# Patient Record
Sex: Female | Born: 1945 | Race: White | Hispanic: No | Marital: Married | Smoking: Never smoker
Health system: Southern US, Community
[De-identification: ages and names within clinical notes are randomized; demographics above are authoritative.]

## PROBLEM LIST (undated history)

## (undated) DIAGNOSIS — E559 Vitamin D deficiency, unspecified: Secondary | ICD-10-CM

## (undated) DIAGNOSIS — G4700 Insomnia, unspecified: Secondary | ICD-10-CM

## (undated) DIAGNOSIS — M353 Polymyalgia rheumatica: Secondary | ICD-10-CM

## (undated) DIAGNOSIS — M8580 Other specified disorders of bone density and structure, unspecified site: Secondary | ICD-10-CM

## (undated) DIAGNOSIS — K649 Unspecified hemorrhoids: Secondary | ICD-10-CM

## (undated) DIAGNOSIS — Z780 Asymptomatic menopausal state: Secondary | ICD-10-CM

## (undated) DIAGNOSIS — R7303 Prediabetes: Secondary | ICD-10-CM

## (undated) DIAGNOSIS — E785 Hyperlipidemia, unspecified: Secondary | ICD-10-CM

## (undated) DIAGNOSIS — I10 Essential (primary) hypertension: Secondary | ICD-10-CM

## (undated) DIAGNOSIS — K625 Hemorrhage of anus and rectum: Secondary | ICD-10-CM

## (undated) DIAGNOSIS — R002 Palpitations: Secondary | ICD-10-CM

## (undated) HISTORY — DX: Prediabetes: R73.03

## (undated) HISTORY — DX: Vitamin D deficiency, unspecified: E55.9

## (undated) HISTORY — DX: Insomnia, unspecified: G47.00

## (undated) HISTORY — PX: ABDOMINAL HYSTERECTOMY: SHX81

## (undated) HISTORY — PX: SHOULDER SURGERY: SHX246

---
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
## 1998-07-13 ENCOUNTER — Other Ambulatory Visit: Admission: RE | Admit: 1998-07-13 | Discharge: 1998-07-13 | Payer: Self-pay | Admitting: Obstetrics and Gynecology

## 1999-06-22 ENCOUNTER — Encounter: Admission: RE | Admit: 1999-06-22 | Discharge: 1999-06-22 | Payer: Self-pay | Admitting: Family Medicine

## 1999-08-09 ENCOUNTER — Other Ambulatory Visit: Admission: RE | Admit: 1999-08-09 | Discharge: 1999-08-09 | Payer: Self-pay | Admitting: Obstetrics and Gynecology

## 1999-12-07 ENCOUNTER — Encounter: Admission: RE | Admit: 1999-12-07 | Discharge: 1999-12-07 | Payer: Self-pay | Admitting: Obstetrics and Gynecology

## 1999-12-07 ENCOUNTER — Encounter: Payer: Self-pay | Admitting: Obstetrics and Gynecology

## 2000-09-11 ENCOUNTER — Other Ambulatory Visit: Admission: RE | Admit: 2000-09-11 | Discharge: 2000-09-11 | Payer: Self-pay | Admitting: Obstetrics and Gynecology

## 2000-12-10 ENCOUNTER — Encounter: Payer: Self-pay | Admitting: Obstetrics and Gynecology

## 2000-12-10 ENCOUNTER — Encounter: Admission: RE | Admit: 2000-12-10 | Discharge: 2000-12-10 | Payer: Self-pay | Admitting: Obstetrics and Gynecology

## 2001-01-12 ENCOUNTER — Encounter: Payer: Self-pay | Admitting: Neurosurgery

## 2001-01-12 ENCOUNTER — Ambulatory Visit (HOSPITAL_COMMUNITY): Admission: RE | Admit: 2001-01-12 | Discharge: 2001-01-12 | Payer: Self-pay | Admitting: Neurosurgery

## 2001-11-14 ENCOUNTER — Other Ambulatory Visit: Admission: RE | Admit: 2001-11-14 | Discharge: 2001-11-14 | Payer: Self-pay | Admitting: Obstetrics and Gynecology

## 2001-12-16 ENCOUNTER — Encounter: Admission: RE | Admit: 2001-12-16 | Discharge: 2001-12-16 | Payer: Self-pay | Admitting: Obstetrics and Gynecology

## 2001-12-16 ENCOUNTER — Encounter: Payer: Self-pay | Admitting: Obstetrics and Gynecology

## 2002-08-06 ENCOUNTER — Encounter: Payer: Self-pay | Admitting: Family Medicine

## 2002-08-06 ENCOUNTER — Encounter: Admission: RE | Admit: 2002-08-06 | Discharge: 2002-08-06 | Payer: Self-pay | Admitting: Family Medicine

## 2003-02-10 ENCOUNTER — Other Ambulatory Visit: Admission: RE | Admit: 2003-02-10 | Discharge: 2003-02-10 | Payer: Self-pay | Admitting: Obstetrics and Gynecology

## 2005-06-28 ENCOUNTER — Encounter: Admission: RE | Admit: 2005-06-28 | Discharge: 2005-06-28 | Payer: Self-pay | Admitting: Obstetrics and Gynecology

## 2006-07-24 ENCOUNTER — Encounter: Admission: RE | Admit: 2006-07-24 | Discharge: 2006-07-24 | Payer: Self-pay | Admitting: Obstetrics and Gynecology

## 2007-09-24 ENCOUNTER — Encounter: Admission: RE | Admit: 2007-09-24 | Discharge: 2007-09-24 | Payer: Self-pay | Admitting: Obstetrics and Gynecology

## 2008-09-28 ENCOUNTER — Encounter: Admission: RE | Admit: 2008-09-28 | Discharge: 2008-09-28 | Payer: Self-pay | Admitting: Obstetrics and Gynecology

## 2008-09-29 ENCOUNTER — Encounter: Admission: RE | Admit: 2008-09-29 | Discharge: 2008-09-29 | Payer: Self-pay | Admitting: Obstetrics and Gynecology

## 2008-09-29 HISTORY — PX: BREAST BIOPSY: SHX20

## 2009-10-04 ENCOUNTER — Encounter: Admission: RE | Admit: 2009-10-04 | Discharge: 2009-10-04 | Payer: Self-pay | Admitting: Obstetrics and Gynecology

## 2010-06-12 ENCOUNTER — Encounter: Payer: Self-pay | Admitting: Obstetrics and Gynecology

## 2010-10-11 LAB — HM DEXA SCAN

## 2011-09-03 ENCOUNTER — Other Ambulatory Visit: Payer: Self-pay | Admitting: Family Medicine

## 2011-09-03 DIAGNOSIS — Z1231 Encounter for screening mammogram for malignant neoplasm of breast: Secondary | ICD-10-CM

## 2011-10-12 ENCOUNTER — Ambulatory Visit
Admission: RE | Admit: 2011-10-12 | Discharge: 2011-10-12 | Disposition: A | Discharge status: Home / Self Care | Payer: BC Managed Care – PPO | Source: Ambulatory Visit | Attending: Family Medicine | Admitting: Family Medicine

## 2011-10-12 DIAGNOSIS — Z1231 Encounter for screening mammogram for malignant neoplasm of breast: Secondary | ICD-10-CM

## 2012-11-19 LAB — HM DEXA SCAN

## 2013-01-13 ENCOUNTER — Other Ambulatory Visit: Payer: Self-pay

## 2013-01-13 DIAGNOSIS — Z1231 Encounter for screening mammogram for malignant neoplasm of breast: Secondary | ICD-10-CM

## 2013-01-23 ENCOUNTER — Ambulatory Visit
Admission: RE | Admit: 2013-01-23 | Discharge: 2013-01-23 | Disposition: A | Discharge status: Home / Self Care | Payer: BC Managed Care – PPO | Source: Ambulatory Visit

## 2013-01-23 DIAGNOSIS — Z1231 Encounter for screening mammogram for malignant neoplasm of breast: Secondary | ICD-10-CM

## 2013-07-15 ENCOUNTER — Other Ambulatory Visit (HOSPITAL_COMMUNITY): Payer: Self-pay | Admitting: *Deleted

## 2013-07-15 DIAGNOSIS — Z139 Encounter for screening, unspecified: Secondary | ICD-10-CM

## 2013-07-16 ENCOUNTER — Ambulatory Visit (HOSPITAL_COMMUNITY): Payer: Medicare Other

## 2013-07-20 ENCOUNTER — Ambulatory Visit (HOSPITAL_COMMUNITY): Payer: Medicare Other

## 2013-07-22 ENCOUNTER — Other Ambulatory Visit (HOSPITAL_COMMUNITY): Payer: Self-pay | Admitting: *Deleted

## 2013-07-22 ENCOUNTER — Ambulatory Visit (HOSPITAL_COMMUNITY): Payer: Medicare Other | Attending: Family Medicine

## 2013-07-22 DIAGNOSIS — Z1389 Encounter for screening for other disorder: Secondary | ICD-10-CM | POA: Insufficient documentation

## 2013-07-22 DIAGNOSIS — Z139 Encounter for screening, unspecified: Secondary | ICD-10-CM

## 2013-07-22 DIAGNOSIS — I700 Atherosclerosis of aorta: Secondary | ICD-10-CM

## 2014-03-09 ENCOUNTER — Other Ambulatory Visit: Payer: Self-pay

## 2014-03-09 DIAGNOSIS — Z1231 Encounter for screening mammogram for malignant neoplasm of breast: Secondary | ICD-10-CM

## 2014-03-16 ENCOUNTER — Ambulatory Visit
Admission: RE | Admit: 2014-03-16 | Discharge: 2014-03-16 | Disposition: A | Discharge status: Home / Self Care | Payer: 59 | Source: Ambulatory Visit

## 2014-03-16 DIAGNOSIS — Z1231 Encounter for screening mammogram for malignant neoplasm of breast: Secondary | ICD-10-CM

## 2014-04-16 ENCOUNTER — Other Ambulatory Visit (HOSPITAL_COMMUNITY): Payer: Self-pay | Admitting: Family Medicine

## 2014-07-07 ENCOUNTER — Other Ambulatory Visit: Payer: Self-pay | Admitting: Obstetrics and Gynecology

## 2014-07-08 LAB — CYTOLOGY - PAP

## 2014-10-07 ENCOUNTER — Other Ambulatory Visit: Payer: Self-pay | Admitting: Gastroenterology

## 2014-12-08 LAB — HM DEXA SCAN

## 2015-03-14 ENCOUNTER — Other Ambulatory Visit: Payer: Self-pay

## 2015-03-14 DIAGNOSIS — Z1231 Encounter for screening mammogram for malignant neoplasm of breast: Secondary | ICD-10-CM

## 2015-04-20 ENCOUNTER — Ambulatory Visit
Admission: RE | Admit: 2015-04-20 | Discharge: 2015-04-20 | Disposition: A | Discharge status: Home / Self Care | Payer: Medicare Other | Source: Ambulatory Visit

## 2015-04-20 DIAGNOSIS — Z1231 Encounter for screening mammogram for malignant neoplasm of breast: Secondary | ICD-10-CM

## 2016-05-28 ENCOUNTER — Other Ambulatory Visit: Payer: Self-pay | Admitting: Physician Assistant

## 2016-05-28 DIAGNOSIS — Z1231 Encounter for screening mammogram for malignant neoplasm of breast: Secondary | ICD-10-CM

## 2016-07-05 ENCOUNTER — Ambulatory Visit
Admission: RE | Admit: 2016-07-05 | Discharge: 2016-07-05 | Disposition: A | Discharge status: Home / Self Care | Payer: Medicare Other | Source: Ambulatory Visit | Attending: Physician Assistant | Admitting: Physician Assistant

## 2016-07-05 DIAGNOSIS — Z1231 Encounter for screening mammogram for malignant neoplasm of breast: Secondary | ICD-10-CM

## 2017-06-10 ENCOUNTER — Other Ambulatory Visit: Payer: Self-pay | Admitting: Obstetrics and Gynecology

## 2017-06-10 DIAGNOSIS — Z1231 Encounter for screening mammogram for malignant neoplasm of breast: Secondary | ICD-10-CM

## 2017-06-13 LAB — HM DEXA SCAN

## 2017-07-08 ENCOUNTER — Ambulatory Visit: Payer: Medicare Other

## 2017-07-25 ENCOUNTER — Ambulatory Visit
Admission: RE | Admit: 2017-07-25 | Discharge: 2017-07-25 | Disposition: A | Discharge status: Home / Self Care | Payer: Medicare Other | Source: Ambulatory Visit | Attending: Obstetrics and Gynecology | Admitting: Obstetrics and Gynecology

## 2017-07-25 DIAGNOSIS — Z1231 Encounter for screening mammogram for malignant neoplasm of breast: Secondary | ICD-10-CM

## 2018-12-08 ENCOUNTER — Other Ambulatory Visit: Payer: Self-pay | Admitting: Cytopathology

## 2018-12-08 ENCOUNTER — Other Ambulatory Visit: Payer: Self-pay | Admitting: Obstetrics and Gynecology

## 2018-12-08 DIAGNOSIS — Z1231 Encounter for screening mammogram for malignant neoplasm of breast: Secondary | ICD-10-CM

## 2019-01-22 ENCOUNTER — Ambulatory Visit
Admission: RE | Admit: 2019-01-22 | Discharge: 2019-01-22 | Disposition: A | Discharge status: Home / Self Care | Payer: Medicare Other | Source: Ambulatory Visit | Attending: Obstetrics and Gynecology | Admitting: Obstetrics and Gynecology

## 2019-01-22 ENCOUNTER — Other Ambulatory Visit: Payer: Self-pay

## 2019-01-22 DIAGNOSIS — Z1231 Encounter for screening mammogram for malignant neoplasm of breast: Secondary | ICD-10-CM

## 2019-03-16 NOTE — Progress Notes (Signed)
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  Patient has had problems with reflux in the past.  She will feel pain in her substernal area radiating to her neck.  Typically occurs after eating or at night.  Some increase in symptoms recently.  She also has had some chest tightness that begins when she starts walking and improves as she continues.  This has been continuing for years.  No radiation.  No associated symptoms.  Cardiology now asked to evaluate.  She has dyspnea with more vigorous activities but not routine activities.  No orthopnea, PND or pedal edema.  No syncope.  Current Outpatient Medications  Medication Sig Dispense Refill  . ALPRAZolam (XANAX) 0.25 MG tablet Take 0.25 mg by mouth at bedtime as needed for anxiety.    Marland Kitchen aspirin EC 81 MG tablet Take 81 mg by mouth daily.    Marland Kitchen azelaic acid (AZELEX) 20 % cream Apply topically 2 (two) times daily. After skin is thoroughly washed and patted dry, gently but thoroughly massage a thin film of azelaic acid cream into the affected area twice daily, in the morning and evening.    . bisoprolol-hydrochlorothiazide (ZIAC) 10-6.25 MG tablet Take 1 tablet by mouth daily.    Marland Kitchen lisinopril (ZESTRIL) 30 MG tablet Take 30 mg by mouth daily.    . Magnesium 250 MG TABS Take by mouth.    . OMEPRAZOLE PO Take by mouth.    . pravastatin (PRAVACHOL) 20 MG tablet Take 40 mg by mouth daily.     . Vitamin D, Ergocalciferol, (DRISDOL) 1.25 MG (50000 UT) CAPS capsule Take 50,000 Units by mouth. EVERY OTHER WEEK     No current facility-administered medications for this visit.     Allergies  Allergen Reactions  . Statins     MYALGIAS     Past Medical History:  Diagnosis Date  . Hemorrhoids 03/18/2019  . HTN (hypertension) 03/18/2019  . Hyperlipidemia 03/18/2019  . Insomnia   . Osteopenia 03/18/2019  .  Palpitations 03/18/2019  . Polymyalgia rheumatica ([place]) 03/18/2019  . Postmenopausal 03/18/2019  . Prediabetes   . Rectal bleeding 03/18/2019  . Vitamin D deficiency     Past Surgical History:  Procedure Laterality Date  . ABDOMINAL HYSTERECTOMY    . BREAST BIOPSY Left 09/29/2008  . SHOULDER SURGERY      Social History   Socioeconomic History  . Marital status: Married    Spouse name: Not on file  . Number of children: Not on file  . Years of education: Not on file  . Highest education level: Not on file  Occupational History  . Not on file  Social Needs  . Financial resource strain: Not on file  . Food insecurity    Worry: Not on file    Inability: Not on file  . Transportation needs    Medical: Not on file    Non-medical: Not on file  Tobacco Use  . Smoking status: Never Smoker  . Smokeless tobacco: Never Used  Substance and Sexual Activity  . Alcohol use: Not Currently  . Drug use: Not on file  . Sexual activity: Not on file    Comment: MARRIED  Lifestyle  . Physical activity    Days per week: Not on file    Minutes per session: Not on file  . Stress: Not on file  Relationships  . Social connections  Talks on phone: Not on file    Gets together: Not on file    Attends religious service: Not on file    Active member of club or organization: Not on file    Attends meetings of clubs or organizations: Not on file    Relationship status: Not on file  . Intimate partner violence    Fear of current or ex partner: Not on file    Emotionally abused: Not on file    Physically abused: Not on file    Forced sexual activity: Not on file  Other Topics Concern  . Not on file  Social History Narrative  . Not on file    Family History  Problem Relation Age of Onset  . Breast cancer Maternal Aunt   . AAA (abdominal aortic aneurysm) Father     ROS: no fevers or chills, productive cough, hemoptysis, dysphasia, odynophagia, melena, hematochezia, dysuria,  hematuria, rash, seizure activity, orthopnea, PND, pedal edema, claudication. Remaining systems are negative.  Physical Exam:   Blood pressure 130/68, pulse (!) 54, height 5\' 4"  (1.626 m), weight 147 lb (66.7 kg).  General:  Well developed/well nourished in NAD Skin warm/dry Patient not depressed No peripheral clubbing Back-normal HEENT-normal/normal eyelids Neck supple/normal carotid upstroke bilaterally; no bruits; no JVD; no thyromegaly chest - CTA/ normal expansion CV - RRR/normal S1 and S2; no murmurs, rubs or gallops;  PMI nondisplaced Abdomen -NT/ND, no HSM, no mass, + bowel sounds, no bruit 2+ femoral pulses, no bruits Ext-no edema, chords, 2+ DP Neuro-grossly nonfocal  ECG -sinus bradycardia at a rate of 55, no ST changes.  Personally reviewed  A/P  1 chest pain-symptoms are somewhat atypical.  However she does have risk factors.  We will plan a CTA to further assess.  She would be deemed intermediate risk.  2 hypertension-blood pressure is controlled.  Continue present medications and follow.  3 hyperlipidemia-continue statin.  , MD

## 2019-03-18 ENCOUNTER — Encounter: Payer: Self-pay | Admitting: Cardiology

## 2019-03-18 ENCOUNTER — Other Ambulatory Visit: Payer: Self-pay

## 2019-03-18 ENCOUNTER — Ambulatory Visit (INDEPENDENT_AMBULATORY_CARE_PROVIDER_SITE_OTHER): Payer: Medicare Other | Admitting: Cardiology

## 2019-03-18 VITALS — BP 130/68 | HR 54 | Ht 64.0 in | Wt 147.0 lb

## 2019-03-18 DIAGNOSIS — M353 Polymyalgia rheumatica: Secondary | ICD-10-CM

## 2019-03-18 DIAGNOSIS — E7800 Pure hypercholesterolemia, unspecified: Secondary | ICD-10-CM

## 2019-03-18 DIAGNOSIS — I10 Essential (primary) hypertension: Secondary | ICD-10-CM

## 2019-03-18 DIAGNOSIS — M8580 Other specified disorders of bone density and structure, unspecified site: Secondary | ICD-10-CM

## 2019-03-18 DIAGNOSIS — R002 Palpitations: Secondary | ICD-10-CM

## 2019-03-18 DIAGNOSIS — R072 Precordial pain: Secondary | ICD-10-CM

## 2019-03-18 DIAGNOSIS — K649 Unspecified hemorrhoids: Secondary | ICD-10-CM

## 2019-03-18 DIAGNOSIS — E785 Hyperlipidemia, unspecified: Secondary | ICD-10-CM

## 2019-03-18 DIAGNOSIS — K625 Hemorrhage of anus and rectum: Secondary | ICD-10-CM

## 2019-03-18 DIAGNOSIS — Z780 Asymptomatic menopausal state: Secondary | ICD-10-CM

## 2019-03-18 HISTORY — DX: Asymptomatic menopausal state: Z78.0

## 2019-03-18 HISTORY — DX: Essential (primary) hypertension: I10

## 2019-03-18 HISTORY — DX: Unspecified hemorrhoids: K64.9

## 2019-03-18 HISTORY — DX: Other specified disorders of bone density and structure, unspecified site: M85.80

## 2019-03-18 HISTORY — DX: Palpitations: R00.2

## 2019-03-18 HISTORY — DX: Hemorrhage of anus and rectum: K62.5

## 2019-03-18 HISTORY — DX: Polymyalgia rheumatica: M35.3

## 2019-03-18 HISTORY — DX: Hyperlipidemia, unspecified: E78.5

## 2019-03-18 NOTE — Patient Instructions (Addendum)
Medication Instructions:  NO CHANGE *If you need a refill on your cardiac medications before your next appointment, please call your pharmacy*  Lab Work: If you have labs (blood work) drawn today and your tests are completely normal, you will receive your results only by: Marland Kitchen MyChart Message (if you have MyChart) OR . A paper copy in the mail If you have any lab test that is abnormal or we need to change your treatment, we will call you to review the results.  Testing/Procedures: Your cardiac CT will be scheduled at one of the below locations:   [facility] [address] [place], [place] [address] [phone]  If scheduled at [facility], please arrive at the [facility] main entrance of [facility] 30-45 minutes prior to test start time. Proceed to the [facility] Radiology Department (first floor) to check-in and test prep.  Please follow these instructions carefully (unless otherwise directed):  Hold all erectile dysfunction medications at least 3 days (72 hrs) prior to test.  On the Night Before the Test: . Be sure to Drink plenty of water. . Do not consume any caffeinated/decaffeinated beverages or chocolate 12 hours prior to your test. . Do not take any antihistamines 12 hours prior to your test.  On the Day of the Test: . Drink plenty of water. Do not drink any water within one hour of the test. . Do not eat any food 4 hours prior to the test. . You may take your regular medications prior to the test.  . HOLD Furosemide/Hydrochlorothiazide morning of the test. . FEMALES- please wear underwire-free bra if available      After the Test: . Drink plenty of water. . After receiving IV contrast, you may experience a mild flushed feeling. This is normal. . On occasion, you may experience a mild rash up to 24 hours after the test. This is not dangerous. If this occurs, you can take Benadryl 25 mg and increase your fluid intake. . If you experience  trouble breathing, this can be serious. If it is severe call 911 IMMEDIATELY. If it is mild, please call our office. . If you take any of these medications: Glipizide/Metformin, Avandament, Glucavance, please do not take 48 hours after completing test unless otherwise instructed.   Once we have confirmed authorization from your insurance company, we will call you to set up a date and time for your test.   For non-scheduling related questions, please contact the cardiac imaging nurse navigator should you have any questions/concerns: Marchia Bond, RN Navigator Cardiac Imaging [facility] [phone] Office     Follow-Up: At [facility], you and your health needs are our priority.  As part of our continuing mission to provide you with exceptional heart care, we have created designated Provider Care Teams.  These Care Teams include your primary Cardiologist (physician) and Advanced Practice Providers (APPs -  Physician Assistants and Nurse Practitioners) who all work together to provide you with the care you need, when you need it.  Your next appointment:   AS NEEDED PENDING TEST RESULTS  Provider:   Kirk Ruths, MD

## 2019-05-04 ENCOUNTER — Other Ambulatory Visit: Payer: Self-pay | Admitting: *Deleted

## 2019-05-04 DIAGNOSIS — R072 Precordial pain: Secondary | ICD-10-CM

## 2019-05-06 ENCOUNTER — Encounter: Payer: Self-pay | Admitting: *Deleted

## 2019-05-06 LAB — BASIC METABOLIC PANEL
BUN/Creatinine Ratio: 23 (ref 12–28)
BUN: 16 mg/dL (ref 8–27)
CO2: 25 mmol/L (ref 20–29)
Calcium: 9.5 mg/dL (ref 8.7–10.3)
Chloride: 101 mmol/L (ref 96–106)
Creatinine, Ser: 0.7 mg/dL (ref 0.57–1.00)
GFR calc Af Amer: 101 mL/min/{1.73_m2} (ref >59)
GFR calc non Af Amer: 87 mL/min/{1.73_m2} (ref >59)
Glucose: 83 mg/dL (ref 65–99)
Potassium: 4.8 mmol/L (ref 3.5–5.2)
Sodium: 140 mmol/L (ref 134–144)

## 2019-05-07 ENCOUNTER — Telehealth (HOSPITAL_COMMUNITY): Payer: Self-pay | Admitting: Emergency Medicine

## 2019-05-07 NOTE — Telephone Encounter (Signed)
Reaching out to patient to offer assistance regarding upcoming cardiac imaging study; pt verbalizes understanding of appt date/time, parking situation and where to check in, pre-test NPO status and medications ordered, and verified current allergies; name and call back number provided for further questions should they arise Willye Javier RN Navigator Cardiac Imaging [facility] [phone] office [phone] cell 

## 2019-05-08 ENCOUNTER — Ambulatory Visit (HOSPITAL_COMMUNITY)
Admission: RE | Admit: 2019-05-08 | Discharge: 2019-05-08 | Disposition: A | Discharge status: Home / Self Care | Payer: Medicare Other | Source: Ambulatory Visit | Attending: Cardiology | Admitting: Cardiology

## 2019-05-08 ENCOUNTER — Other Ambulatory Visit: Payer: Self-pay

## 2019-05-08 DIAGNOSIS — R072 Precordial pain: Secondary | ICD-10-CM | POA: Insufficient documentation

## 2019-05-08 MED ORDER — IOHEXOL 350 MG/ML SOLN
80.0000 mL | Freq: Once | INTRAVENOUS | Status: AC | PRN
  Administered 2019-05-08: 15:00:00 80 mL via INTRAVENOUS

## 2019-05-08 MED ORDER — NITROGLYCERIN 0.4 MG SL SUBL
0.8000 mg | SUBLINGUAL_TABLET | Freq: Once | SUBLINGUAL | Status: AC
  Administered 2019-05-08: 15:00:00 0.8 mg via SUBLINGUAL

## 2019-05-08 MED ORDER — NITROGLYCERIN 0.4 MG SL SUBL
SUBLINGUAL_TABLET | SUBLINGUAL | Status: AC
  Filled 2019-05-08: qty 2

## 2019-05-21 ENCOUNTER — Ambulatory Visit (HOSPITAL_COMMUNITY): Payer: Medicare Other

## 2019-12-30 ENCOUNTER — Other Ambulatory Visit: Payer: Self-pay | Admitting: Obstetrics and Gynecology

## 2019-12-30 DIAGNOSIS — Z1231 Encounter for screening mammogram for malignant neoplasm of breast: Secondary | ICD-10-CM

## 2020-01-05 DIAGNOSIS — J385 Laryngeal spasm: Secondary | ICD-10-CM | POA: Insufficient documentation

## 2020-01-26 ENCOUNTER — Other Ambulatory Visit: Payer: Self-pay

## 2020-01-26 ENCOUNTER — Ambulatory Visit
Admission: RE | Admit: 2020-01-26 | Discharge: 2020-01-26 | Disposition: A | Discharge status: Home / Self Care | Payer: Medicare PPO | Source: Ambulatory Visit | Attending: Obstetrics and Gynecology | Admitting: Obstetrics and Gynecology

## 2020-01-26 DIAGNOSIS — Z1231 Encounter for screening mammogram for malignant neoplasm of breast: Secondary | ICD-10-CM

## 2020-02-11 DIAGNOSIS — Z23 Encounter for immunization: Secondary | ICD-10-CM | POA: Diagnosis not present

## 2020-07-01 DIAGNOSIS — I10 Essential (primary) hypertension: Secondary | ICD-10-CM | POA: Diagnosis not present

## 2020-07-01 DIAGNOSIS — M722 Plantar fascial fibromatosis: Secondary | ICD-10-CM | POA: Diagnosis not present

## 2020-07-01 DIAGNOSIS — I7300 Raynaud's syndrome without gangrene: Secondary | ICD-10-CM | POA: Diagnosis not present

## 2020-07-01 DIAGNOSIS — I868 Varicose veins of other specified sites: Secondary | ICD-10-CM | POA: Diagnosis not present

## 2020-07-01 DIAGNOSIS — I739 Peripheral vascular disease, unspecified: Secondary | ICD-10-CM | POA: Diagnosis not present

## 2020-07-04 ENCOUNTER — Other Ambulatory Visit: Payer: Self-pay | Admitting: Nurse Practitioner

## 2020-07-04 DIAGNOSIS — I739 Peripheral vascular disease, unspecified: Secondary | ICD-10-CM

## 2020-07-06 ENCOUNTER — Ambulatory Visit
Admission: RE | Admit: 2020-07-06 | Discharge: 2020-07-06 | Disposition: A | Discharge status: Home / Self Care | Payer: Medicare PPO | Source: Ambulatory Visit | Attending: Nurse Practitioner | Admitting: Nurse Practitioner

## 2020-07-06 DIAGNOSIS — I739 Peripheral vascular disease, unspecified: Secondary | ICD-10-CM | POA: Diagnosis not present

## 2020-07-19 DIAGNOSIS — E7800 Pure hypercholesterolemia, unspecified: Secondary | ICD-10-CM | POA: Diagnosis not present

## 2020-07-19 DIAGNOSIS — I10 Essential (primary) hypertension: Secondary | ICD-10-CM | POA: Diagnosis not present

## 2020-07-19 DIAGNOSIS — G4700 Insomnia, unspecified: Secondary | ICD-10-CM | POA: Diagnosis not present

## 2020-07-19 DIAGNOSIS — R7303 Prediabetes: Secondary | ICD-10-CM | POA: Diagnosis not present

## 2020-07-19 DIAGNOSIS — Z0000 Encounter for general adult medical examination without abnormal findings: Secondary | ICD-10-CM | POA: Diagnosis not present

## 2020-07-19 DIAGNOSIS — E559 Vitamin D deficiency, unspecified: Secondary | ICD-10-CM | POA: Diagnosis not present

## 2020-08-02 ENCOUNTER — Encounter: Payer: Medicare PPO | Admitting: Vascular Surgery

## 2020-08-09 ENCOUNTER — Ambulatory Visit: Payer: Medicare PPO | Admitting: Vascular Surgery

## 2020-08-09 ENCOUNTER — Encounter: Payer: Self-pay | Admitting: Vascular Surgery

## 2020-08-09 ENCOUNTER — Other Ambulatory Visit: Payer: Self-pay

## 2020-08-09 VITALS — BP 147/59 | HR 59 | Temp 98.4°F | Resp 20 | Ht 64.0 in | Wt 146.0 lb

## 2020-08-09 DIAGNOSIS — R6889 Other general symptoms and signs: Secondary | ICD-10-CM | POA: Diagnosis not present

## 2020-08-09 NOTE — Progress Notes (Signed)
[facility] [place]  ASSESSMENT / PLAN: 75 y.o. female with abnormal home PAD screening test. She has no evidence of hemodynamically significant peripheral arterial disease on exam today or by ABI. She did have a slightly low right great toe pressure ( ) on ABI/PVR, but her ankle arm index was normal bilaterally. She has known history of Raynaud's phenomenon. I counseled her that I do not think she has peripheral arterial disease. Follow up with me as needed.  CHIEF COMPLAINT: Abnormal screening exam  HISTORY OF PRESENT ILLNESS: Rachel PIPPEN is a 75 y.o. female referred to clinic for evaluation of possible PAD.  She had a [employer] home screen for PAD performed which was abnormal she discussed the results with her primary care physician, who referred her for vascular evaluation and ABI.  ABI was read as abnormal.  She was referred for evaluation.  On my interview, the patient reports pain in the fingers and toes after exposure to cold.  The pain resolves with warming.  The patient is ambulatory.  She can walk as far she likes.  She denies symptoms of intermittent claudication, ischemic rest pain, or ischemic ulceration.  Past Medical History:  Diagnosis Date   Hemorrhoids 03/18/2019   HTN (hypertension) 03/18/2019   Hyperlipidemia 03/18/2019   Insomnia    Osteopenia 03/18/2019   Palpitations 03/18/2019   Polymyalgia rheumatica ([facility]) 03/18/2019   Postmenopausal 03/18/2019   Prediabetes    Rectal bleeding 03/18/2019   Vitamin D deficiency     Past Surgical History:  Procedure Laterality Date   ABDOMINAL HYSTERECTOMY     BREAST BIOPSY Left 09/29/2008   SHOULDER SURGERY      Family History  Problem Relation Age of Onset   Breast cancer Maternal Aunt    AAA (abdominal aortic aneurysm) Father     Social History   Socioeconomic History   Marital status: Married    Spouse name: Not on file   Number of children: Not on file   Years of  education: Not on file   Highest education level: Not on file  Occupational History   Not on file  Tobacco Use   Smoking status: Never Smoker   Smokeless tobacco: Never Used  Vaping Use   Vaping Use: Never used  Substance and Sexual Activity   Alcohol use: Not Currently   Drug use: Never   Sexual activity: Not on file    Comment: MARRIED  Other Topics Concern   Not on file  Social History Narrative   Not on file   Social Determinants of Health   Financial Resource Strain: Not on file  Food Insecurity: Not on file  Transportation Needs: Not on file  Physical Activity: Not on file  Stress: Not on file  Social Connections: Not on file  Intimate Partner Violence: Not on file    Allergies  Allergen Reactions   Amlodipine Besylate     Other reaction(s): flushed sensation/rapid heartbeat/difficulty sleeping   Statins     MYALGIAS    Current Outpatient Medications  Medication Sig Dispense Refill   ALPRAZolam (XANAX) 0.25 MG tablet Take 0.25 mg by mouth at bedtime as needed for anxiety.     aspirin EC 81 MG tablet Take 81 mg by mouth daily.     azelaic acid (AZELEX) 20 % cream Apply topically 2 (two) times daily. After skin is thoroughly washed and patted dry, gently but thoroughly massage a thin film of azelaic acid cream into the affected area twice daily,  in the morning and evening.     bisoprolol-hydrochlorothiazide (ZIAC) 10-6.25 MG tablet Take 1 tablet by mouth daily.     lisinopril (ZESTRIL) 30 MG tablet Take 30 mg by mouth daily.     Magnesium 250 MG TABS Take by mouth.     pravastatin (PRAVACHOL) 20 MG tablet Take 80 mg by mouth daily.     Vitamin D, Ergocalciferol, (DRISDOL) 1.25 MG (50000 UT) CAPS capsule Take 50,000 Units by mouth. EVERY OTHER WEEK     OMEPRAZOLE PO Take by mouth. (Patient not taking: Reported on 08/09/2020)     No current facility-administered medications for this visit.    REVIEW OF SYSTEMS:  [X]  denotes positive finding,  [ ]  denotes negative finding Cardiac  Comments:  Chest pain or chest pressure:    Shortness of breath upon exertion:    Short of breath when lying flat:    Irregular heart rhythm:        Vascular    Pain in calf, thigh, or hip brought on by ambulation:    Pain in feet at night that wakes you up from your sleep:     Blood clot in your veins:    Leg swelling:         Pulmonary    Oxygen at home:    Productive cough:     Wheezing:         Neurologic    Sudden weakness in arms or legs:     Sudden numbness in arms or legs:     Sudden onset of difficulty speaking or slurred speech:    Temporary loss of vision in one eye:     Problems with dizziness:         Gastrointestinal    Blood in stool:     Vomited blood:         Genitourinary    Burning when urinating:     Blood in urine:        Psychiatric    Major depression:         Hematologic    Bleeding problems:    Problems with blood clotting too easily:        Skin    Rashes or ulcers:        Constitutional    Fever or chills:      PHYSICAL EXAM  Vitals:   08/09/20 1332  BP: (!) 147/59  Pulse: (!) 59  Resp: 20  Temp: 98.4 F (36.9 C)  SpO2: 98%  Weight: 146 lb (66.2 kg)  Height: 5\' 4"  (1.626 m)    Constitutional: well appearing. no distress. Appears well nourished.  Neurologic: CN intact. no focal findings. non sensory loss. Psychiatric: Mood and affect symmetric and appropriate. Eyes: No icterus. No conjunctival pallor. Ears, nose, throat: mucous membranes moist. Midline trachea.  Cardiac: regular rate and rhythm.  Respiratory: unlabored. Abdominal: soft, non-tender, non-distended.  Peripheral vascular:  2+ radial bilaterally  2+ popliteal bilaterally  2+ PT bilaterally Extremity: No edema. No cyanosis. No pallor.  Skin: No gangrene. No ulceration.  Lymphatic: No Stemmer's sign. No palpable lymphadenopathy.  PERTINENT LABORATORY AND RADIOLOGIC DATA  Most recent CBC No flowsheet data found.    Most recent CMP CMP Latest Ref Rng & Units 05/05/2019  Glucose 65 - 99 mg/dL 83  BUN 8 - 27 mg/dL 16  Creatinine 08/11/20 - mg/dL 05/07/2019  Sodium 8.29 - 5.62 mmol/L 140  Potassium 3.5 - 5.2 mmol/L 4.8  Chloride 96 - 106  mmol/L 101  CO2 20 - 29 mmol/L 25  Calcium 8.7 - 10.3 mg/dL 9.5   CLINICAL DATA:  Peripheral arterial disease  Hypertension  Hyperlipidemia  EXAM: NONINVASIVE PHYSIOLOGIC VASCULAR STUDY OF BILATERAL LOWER EXTREMITIES  TECHNIQUE: Non-invasive vascular evaluation of both lower extremities was performed at rest, including calculation of ankle-brachial indices, multiple segmental pressure evaluation, segmental Doppler and segmental pulse volume recording.  COMPARISON:  None.  FINDINGS: Right Lower Extremity  Resting ABI:  1.08  Resting TBI: 0.36  Segmental Pressures: Normal segmental pressures, no significant (20 mmHg) pressure gradient between adjacent segments. Great toe pressure: 60 mm Hg  Arterial Waveforms: Normal multiphasic arterial waveforms.  PVRs: Normal PVRs with maintained waveform amplitude, augmentation and quality.  Left Lower Extremity:  Resting ABI: 1.08  Resting TBI: 0.55  Segmental Pressures: Normal segmental pressures, no significant (20 mmHg) pressure gradient between adjacent segments. Great toe pressure: 92 mm Hg  Arterial Waveforms: Normal tri-phasic arterial waveforms.  PVRs: Normal PVRs with maintained waveform amplitude, augmentation and quality.  Other: Symmetric upper extremity pressures.  Ankle Brachial index  > 1.4 Non diagnostic secondary to incompressible vessel calcifications  1.0-1.4       Normal  0.9-0.99     Borderline PAD  0.8-0.89     Mild PAD  0.5-0.79     Moderate PAD  < 0.5          Severe PAD  Toe Brachial Index  Normal     >0.65  Moderate  0.53-0.64  Severe     <0.23  Toe Pressures  Absolute toe pressure >69mmHg sufficient for wound  healing.  Toe pressures <65mmHg = critical limb ischemia.  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  ABI examination is otherwise within normal limits.   Electronically Signed   By: Acquanetta Belling M.D.   On: 07/06/2020 15:45  Rande Brunt. Lenell Antu, MD [facility] Phone Number: [phone] 08/09/2020 1:57 PM

## 2020-09-21 ENCOUNTER — Ambulatory Visit: Payer: Medicare PPO | Attending: Internal Medicine

## 2020-09-21 DIAGNOSIS — Z23 Encounter for immunization: Secondary | ICD-10-CM

## 2020-09-21 NOTE — Progress Notes (Signed)
   Covid-19 Vaccination Clinic  Name:  Rachel Rios    MRN: [id number] DOB: [birth year]  09/21/2020  [name] was observed post Covid-19 immunization for 15 minutes without incident. She was provided with Vaccine Information Sheet and instruction to access the V-Safe system.   [name] was instructed to call 911 with any severe reactions post vaccine: Marland Kitchen Difficulty breathing  . Swelling of face and throat  . A fast heartbeat  . A bad rash all over body  . Dizziness and weakness   Immunizations Administered    Name Date Dose VIS Date Route   PFIZER Comrnaty(Gray TOP) Covid-19 Vaccine 09/21/2020 11:18 AM 0.3 mL 04/28/2020 Intramuscular   Manufacturer: [employer], [employer]   Lot: [id number]   NDC: [phone]

## 2020-09-27 ENCOUNTER — Other Ambulatory Visit (HOSPITAL_BASED_OUTPATIENT_CLINIC_OR_DEPARTMENT_OTHER): Payer: Self-pay

## 2020-09-27 MED ORDER — PFIZER-BIONT COVID-19 VAC-TRIS 30 MCG/0.3ML IM SUSP
INTRAMUSCULAR | 0 refills | Status: DC
  Filled 2020-09-27: qty 0.3, 1d supply, fill #0

## 2020-11-01 ENCOUNTER — Ambulatory Visit: Payer: Medicare PPO | Admitting: Podiatry

## 2020-11-01 ENCOUNTER — Other Ambulatory Visit: Payer: Self-pay

## 2020-11-01 ENCOUNTER — Encounter: Payer: Self-pay | Admitting: Podiatry

## 2020-11-01 ENCOUNTER — Ambulatory Visit (INDEPENDENT_AMBULATORY_CARE_PROVIDER_SITE_OTHER): Payer: Medicare PPO

## 2020-11-01 DIAGNOSIS — G4700 Insomnia, unspecified: Secondary | ICD-10-CM | POA: Insufficient documentation

## 2020-11-01 DIAGNOSIS — R109 Unspecified abdominal pain: Secondary | ICD-10-CM | POA: Insufficient documentation

## 2020-11-01 DIAGNOSIS — E559 Vitamin D deficiency, unspecified: Secondary | ICD-10-CM | POA: Insufficient documentation

## 2020-11-01 DIAGNOSIS — M79672 Pain in left foot: Secondary | ICD-10-CM

## 2020-11-01 DIAGNOSIS — Z860100 Personal history of colon polyps, unspecified: Secondary | ICD-10-CM | POA: Insufficient documentation

## 2020-11-01 DIAGNOSIS — K219 Gastro-esophageal reflux disease without esophagitis: Secondary | ICD-10-CM | POA: Insufficient documentation

## 2020-11-01 DIAGNOSIS — M722 Plantar fascial fibromatosis: Secondary | ICD-10-CM

## 2020-11-01 DIAGNOSIS — L719 Rosacea, unspecified: Secondary | ICD-10-CM | POA: Insufficient documentation

## 2020-11-01 DIAGNOSIS — I7300 Raynaud's syndrome without gangrene: Secondary | ICD-10-CM | POA: Insufficient documentation

## 2020-11-01 DIAGNOSIS — F419 Anxiety disorder, unspecified: Secondary | ICD-10-CM | POA: Insufficient documentation

## 2020-11-01 MED ORDER — MELOXICAM 7.5 MG PO TABS: 7.5000 mg | ORAL_TABLET | Freq: Every day | ORAL | 0 refills | Status: DC

## 2020-11-01 MED ORDER — TRIAMCINOLONE ACETONIDE 10 MG/ML IJ SUSP: 10.0000 mg | Freq: Once | INTRAMUSCULAR | Status: AC

## 2020-11-01 NOTE — Patient Instructions (Signed)
Plantar Fasciitis (Heel Spur Syndrome) with Rehab The plantar fascia is a fibrous, ligament-like, soft-tissue structure that spans the bottom of the foot. Plantar fasciitis is a condition that causes pain in the foot due to inflammation of the tissue. SYMPTOMS   Pain and tenderness on the underneath side of the foot.  Pain that worsens with standing or walking. CAUSES  Plantar fasciitis is caused by irritation and injury to the plantar fascia on the underneath side of the foot. Common mechanisms of injury include:  Direct trauma to bottom of the foot.  Damage to a small nerve that runs under the foot where the main fascia attaches to the heel bone.  Stress placed on the plantar fascia due to bone spurs. RISK INCREASES WITH:   Activities that place stress on the plantar fascia (running, jumping, pivoting, or cutting).  Poor strength and flexibility.  Improperly fitted shoes.  Tight calf muscles.  Flat feet.  Failure to warm-up properly before activity.  Obesity. PREVENTION  Warm up and stretch properly before activity.  Allow for adequate recovery between workouts.  Maintain physical fitness:  Strength, flexibility, and endurance.  Cardiovascular fitness.  Maintain a health body weight.  Avoid stress on the plantar fascia.  Wear properly fitted shoes, including arch supports for individuals who have flat feet.  PROGNOSIS  If treated properly, then the symptoms of plantar fasciitis usually resolve without surgery. However, occasionally surgery is necessary.  RELATED COMPLICATIONS   Recurrent symptoms that may result in a chronic condition.  Problems of the lower back that are caused by compensating for the injury, such as limping.  Pain or weakness of the foot during push-off following surgery.  Chronic inflammation, scarring, and partial or complete fascia tear, occurring more often from repeated injections.  TREATMENT  Treatment initially involves the  use of ice and medication to help reduce pain and inflammation. The use of strengthening and stretching exercises may help reduce pain with activity, especially stretches of the Achilles tendon. These exercises may be performed at home or with a therapist. Your caregiver may recommend that you use heel cups of arch supports to help reduce stress on the plantar fascia. Occasionally, corticosteroid injections are given to reduce inflammation. If symptoms persist for greater than 6 months despite non-surgical (conservative), then surgery may be recommended.   MEDICATION   If pain medication is necessary, then nonsteroidal anti-inflammatory medications, such as aspirin and ibuprofen, or other minor pain relievers, such as acetaminophen, are often recommended.  Do not take pain medication within 7 days before surgery.  Prescription pain relievers may be given if deemed necessary by your caregiver. Use only as directed and only as much as you need.  Corticosteroid injections may be given by your caregiver. These injections should be reserved for the most serious cases, because they may only be given a certain number of times.  HEAT AND COLD  Cold treatment (icing) relieves pain and reduces inflammation. Cold treatment should be applied for 10 to 15 minutes every 2 to 3 hours for inflammation and pain and immediately after any activity that aggravates your symptoms. Use ice packs or massage the area with a piece of ice (ice massage).  Heat treatment may be used prior to performing the stretching and strengthening activities prescribed by your caregiver, physical therapist, or athletic trainer. Use a heat pack or soak the injury in warm water.  SEEK IMMEDIATE MEDICAL CARE IF:  Treatment seems to offer no benefit, or the condition worsens.  Any medications   produce adverse side effects.  EXERCISES- RANGE OF MOTION (ROM) AND STRETCHING EXERCISES - Plantar Fasciitis (Heel Spur Syndrome) These exercises  may help you when beginning to rehabilitate your injury. Your symptoms may resolve with or without further involvement from your physician, physical therapist or athletic trainer. While completing these exercises, remember:   Restoring tissue flexibility helps normal motion to return to the joints. This allows healthier, less painful movement and activity.  An effective stretch should be held for at least 30 seconds.  A stretch should never be painful. You should only feel a gentle lengthening or release in the stretched tissue.  RANGE OF MOTION - Toe Extension, Flexion  Sit with your right / left leg crossed over your opposite knee.  Grasp your toes and gently pull them back toward the top of your foot. You should feel a stretch on the bottom of your toes and/or foot.  Hold this stretch for 10 seconds.  Now, gently pull your toes toward the bottom of your foot. You should feel a stretch on the top of your toes and or foot.  Hold this stretch for 10 seconds. Repeat  times. Complete this stretch 3 times per day.   RANGE OF MOTION - Ankle Dorsiflexion, Active Assisted  Remove shoes and sit on a chair that is preferably not on a carpeted surface.  Place right / left foot under knee. Extend your opposite leg for support.  Keeping your heel down, slide your right / left foot back toward the chair until you feel a stretch at your ankle or calf. If you do not feel a stretch, slide your bottom forward to the edge of the chair, while still keeping your heel down.  Hold this stretch for 10 seconds. Repeat 3 times. Complete this stretch 2 times per day.   STRETCH  Gastroc, Standing  Place hands on wall.  Extend right / left leg, keeping the front knee somewhat bent.  Slightly point your toes inward on your back foot.  Keeping your right / left heel on the floor and your knee straight, shift your weight toward the wall, not allowing your back to arch.  You should feel a gentle stretch  in the right / left calf. Hold this position for 10 seconds. Repeat 3 times. Complete this stretch 2 times per day.  STRETCH  Soleus, Standing  Place hands on wall.  Extend right / left leg, keeping the other knee somewhat bent.  Slightly point your toes inward on your back foot.  Keep your right / left heel on the floor, bend your back knee, and slightly shift your weight over the back leg so that you feel a gentle stretch deep in your back calf.  Hold this position for 10 seconds. Repeat 3 times. Complete this stretch 2 times per day.  STRETCH  Gastrocsoleus, Standing  Note: This exercise can place a lot of stress on your foot and ankle. Please complete this exercise only if specifically instructed by your caregiver.   Place the ball of your right / left foot on a step, keeping your other foot firmly on the same step.  Hold on to the wall or a rail for balance.  Slowly lift your other foot, allowing your body weight to press your heel down over the edge of the step.  You should feel a stretch in your right / left calf.  Hold this position for 10 seconds.  Repeat this exercise with a slight bend in your right /   gain both the endurance and the strength needed for everyday activities through controlled exercises. Complete these exercises as instructed by your physician, physical therapist or athletic trainer. Progress the resistance and repetitions only as guided.  STRENGTH - Towel Curls Sit in a chair positioned on a non-carpeted surface. Place your foot on a towel, keeping your heel on the floor. Pull the towel toward your heel by only curling your toes. Keep your heel on the floor. Repeat 3 times.  Complete this exercise 2 times per day.  STRENGTH - Ankle Inversion Secure one end of a rubber exercise band/tubing to a fixed object (table, pole). Loop the other end around your foot just before your toes. Place your fists between your knees. This will focus your strengthening at your ankle. Slowly, pull your big toe up and in, making sure the band/tubing is positioned to resist the entire motion. Hold this position for 10 seconds. Have your muscles resist the band/tubing as it slowly pulls your foot back to the starting position. Repeat 3 times. Complete this exercises 2 times per day.  Document Released: 05/07/2005 Document Revised: 07/30/2011 Document Reviewed: 08/19/2008 [facility] Patient Information 2014 [employer], [place].   Meloxicam tablets What is this medication? MELOXICAM (mel OX i cam) is a non-steroidal anti-inflammatory drug, also knownas an NSAID. It is used to treat pain, inflammation, and swelling. This medicine may be used for other purposes; ask your health care provider orpharmacist if you have questions. COMMON BRAND NAME(S): Mobic What should I tell my care team before I take this medication? They need to know if you have any of these conditions: asthma (lung or breathing disease) bleeding disorder coronary artery bypass graft (CABG) within the past 2 weeks dehydration heart attack heart disease heart failure high blood pressure if you often drink alcohol kidney disease liver disease smoke tobacco cigarettes stomach bleeding stomach ulcers, other stomach or intestine problems take medicines that treat or prevent blood clots taking other steroids like dexamethasone or prednisone an unusual or allergic reaction to meloxicam, other medicines, foods, dyes, or preservatives pregnant or trying to get pregnant breast-feeding How should I use this medication? Take this medicine by mouth. Take it as directed on the prescription label at the same time every day. You  can take it with or without food. If it upsets your stomach, take it with food. Do not use it more often than directed. There may be unused or extra doses in the bottle after you finish your treatment.Talk to your health care provider if you have questions about your dose. A special MedGuide will be given to you by the pharmacist with eachprescription and refill. Be sure to read this information carefully each time. Talk to your health care provider about the use of this medicine in children.Special care may be needed. Patients over 33 years of age may have a stronger reaction and need a smallerdose. Overdosage: If you think you have taken too much of this medicine contact apoison control center or emergency room at once. NOTE: This medicine is only for you. Do not share this medicine with others. What if I miss a dose? If you miss a dose, take it as soon as you can. If it is almost time for yournext dose, take only that dose. Do not take double or extra doses. What may interact with this medication? Do not take this medicine with any of the following medications: cidofovir ketorolac This medicine may also interact with the following medications: aspirin and aspirin-like medicines certain  medicines for blood pressure, heart disease, irregular heart beat certain medicines for depression, anxiety, or psychotic disturbances certain medicines that treat or prevent blood clots like warfarin, enoxaparin, dalteparin, apixaban, dabigatran, rivaroxaban cyclosporine diuretics fluconazole lithium methotrexate other NSAIDs, medicines for pain and inflammation, like ibuprofen and naproxen pemetrexed This list may not describe all possible interactions. Give your health care provider a list of all the medicines, herbs, non-prescription drugs, or dietary supplements you use. Also tell them if you smoke, drink alcohol, or use illegaldrugs. Some items may interact with your medicine. What should I watch for  while using this medication? Visit your health care provider for regular checks on your progress. Tell your health care provider if your symptoms do not start to get better or if they getworse. Do not take other medicines that contain aspirin, ibuprofen, or naproxen with this medicine. Side effects such as stomach upset, nausea, or ulcers may be more likely to occur. Many non-prescription medicines contain aspirin,ibuprofen, or naproxen. Always read labels carefully. This medicine can cause serious ulcers and bleeding in the stomach. It can happen with no warning. Smoking, drinking alcohol, older age, and poor health can also increase risks. Call your health care provider right away if you havestomach pain or blood in your vomit or stool. This medicine does not prevent a heart attack or stroke. This medicine may increase the chance of a heart attack or stroke. The chance may increase the longer you use this medicine or if you have heart disease. If you take aspirin to prevent a heart attack or stroke, talk to your health care provider aboutusing this medicine. Alcohol may interfere with the effect of this medicine. Avoid alcoholic drinks. This medicine may cause serious skin reactions. They can happen weeks to months after starting the medicine. Contact your health care provider right away if you notice fevers or flu-like symptoms with a rash. The rash may be red or purple and then turn into blisters or peeling of the skin. Or, you might notice a red rash with swelling of the face, lips or lymph nodes in your neck or underyour arms. Talk to your health care provider if you are pregnant before taking this medicine. Taking this medicine between weeks 20 and 30 of pregnancy may harm your unborn baby. Your health care provider will monitor you closely if youneed to take it. After 30 weeks of pregnancy, do not take this medicine. You may get drowsy or dizzy. Do not drive, use machinery, or do anything that needs  mental alertness until you know how this medicine affects you. Do not stand up or sit up quickly, especially if you are an older patient. Thisreduces the risk of dizzy or fainting spells. Be careful brushing or flossing your teeth or using a toothpick because you may get an infection or bleed more easily. If you have any dental work done, Estate agent you are receiving this medicine. This medicine may make it more difficult to get pregnant. Talk to your healthcare provider if you are concerned about your fertility. What side effects may I notice from receiving this medication? Side effects that you should report to your doctor or health care professionalas soon as possible: allergic reactions (skin rash, itching or hives; swelling of the face, lips, or tongue) bleeding (bloody or black, tarry stools; red or dark brown urine; spitting up blood or brown material that looks like coffee grounds; red spots on the skin; unusual bruising or bleeding from the eyes, gums, or nose) blood  clot (chest pain; shortness of breath; pain, swelling, or warmth in the leg) general ill feeling or flu-like symptoms high potassium levels (chest pain; fast, irregular heartbeat; muscle weakness) kidney injury (trouble passing urine or change in the amount of urine) light-colored stool liver injury (dark yellow or brown urine; general ill feeling or flu-like symptoms; loss of appetite, right upper belly pain; unusually weak or tired, yellowing of the eyes or skin) low red blood cell counts (trouble breathing; feeling faint; lightheaded, falls; unusually weak or tired) rash, fever, and swollen lymph nodes redness, blistering, peeling, or loosening of the skin, including inside the mouth stroke (changes in vision; confusion; trouble speaking or understanding; severe headaches; sudden numbness or weakness of the face, arm or leg; trouble walking; dizziness; loss of balance or coordination) Side effects that usually do not  require medical attention (report to yourdoctor or health care professional if they continue or are bothersome): constipation diarrhea dizziness gas headache nausea, vomiting This list may not describe all possible side effects. Call your doctor for medical advice about side effects. You may report side effects to FDA at1-800-FDA-1088. Where should I keep my medication? Keep out of the reach of children and pets. Store at room temperature between 20 and 25 degrees C (68 and 77 degrees F).Protect from moisture. Keep the container tightly closed. Get rid of any unused medicine after the expiration date. To get rid of medicines that are no longer needed or have expired: Take the medicine to a medicine take-back program. Check with your pharmacy or law enforcement to find a location. If you cannot return the medicine, check the label or package insert to see if the medicine should be thrown out in the garbage or flushed down the toilet. If you are not sure, ask your health care provider. If it is safe to put it in the trash, empty the medicine out of the container. Mix the medicine with cat litter, dirt, coffee grounds, or other unwanted substance. Seal the mixture in a bag or container. Put it in the trash. NOTE: This sheet is a summary. It may not cover all possible information. If you have questions about this medicine, talk to your doctor, pharmacist, orhealth care provider.  2022 Elsevier/Gold Standard (2020-04-18 15:14:36)

## 2020-11-05 NOTE — Progress Notes (Signed)
Subjective:   Patient ID: Rachel Rios, female   DOB: 75 y.o.   MRN: [id number]   HPI 75 year old female presents the office today with concerns of left heel pain.  She states that all of her greater than 6 months.  She also points sensation in her heel.  She denies recent injury or trauma.  She states that her sister gets up in the night for the bathroom when she first puts weight on her foot.  No swelling or radiating pain.  No other concerns today.   Review of Systems  All other systems reviewed and are negative.  Past Medical History:  Diagnosis Date   Hemorrhoids 03/18/2019   HTN (hypertension) 03/18/2019   Hyperlipidemia 03/18/2019   Insomnia    Osteopenia 03/18/2019   Palpitations 03/18/2019   Polymyalgia rheumatica (HCC) 03/18/2019   Postmenopausal 03/18/2019   Prediabetes    Rectal bleeding 03/18/2019   Vitamin D deficiency     Past Surgical History:  Procedure Laterality Date   ABDOMINAL HYSTERECTOMY     BREAST BIOPSY Left 09/29/2008   SHOULDER SURGERY       Current Outpatient Medications:    meloxicam (MOBIC) 7.5 MG tablet, Take 1 tablet (7.5 mg total) by mouth daily., Disp: 30 tablet, Rfl: 0   ALPRAZolam (XANAX) 0.25 MG tablet, Take 0.25 mg by mouth at bedtime as needed for anxiety., Disp: , Rfl:    amLODipine (NORVASC) 5 MG tablet, amlodipine 5 mg tablet  TAKE 1 TABLET BY MOUTH EVERY DAY FOR 30 DAYS, Disp: , Rfl:    aspirin EC 81 MG tablet, Take 81 mg by mouth daily., Disp: , Rfl:    azelaic acid (AZELEX) 20 % cream, Apply topically 2 (two) times daily. After skin is thoroughly washed and patted dry, gently but thoroughly massage a thin film of azelaic acid cream into the affected area twice daily, in the morning and evening., Disp: , Rfl:    bisoprolol-hydrochlorothiazide (ZIAC) 10-6.25 MG tablet, Take 1 tablet by mouth daily., Disp: , Rfl:    COVID-19 mRNA Vac-TriS, Pfizer, (PFIZER-BIONT COVID-19 VAC-TRIS) SUSP injection, Inject into the muscle., Disp: 0.3 mL,  Rfl: 0   doxycycline (VIBRA-TABS) 100 MG tablet, 1 tablet, Disp: , Rfl:    famotidine (PEPCID) 40 MG tablet, 1 tablet at bedtime., Disp: , Rfl:    lisinopril (ZESTRIL) 30 MG tablet, Take 30 mg by mouth daily., Disp: , Rfl:    Magnesium 250 MG TABS, Take by mouth., Disp: , Rfl:    OMEPRAZOLE PO, Take by mouth. (Patient not taking: Reported on 08/09/2020), Disp: , Rfl:    pravastatin (PRAVACHOL) 20 MG tablet, Take 80 mg by mouth daily., Disp: , Rfl:    Vitamin D, Ergocalciferol, (DRISDOL) 1.25 MG (50000 UT) CAPS capsule, Take 50,000 Units by mouth. EVERY OTHER WEEK, Disp: , Rfl:   Current Facility-Administered Medications:    triamcinolone acetonide (KENALOG) 10 MG/ML injection 10 mg, 10 mg, Other, Once, Vivi Barrack, DPM  Allergies  Allergen Reactions   Amlodipine Besylate     Other reaction(s): flushed sensation/rapid heartbeat/difficulty sleeping   Statins     MYALGIAS         Objective:  Physical Exam  General: AAO x3, NAD  Dermatological: Skin is warm, dry and supple bilateral.  There are no open sores, no preulcerative lesions, no rash or signs of infection present.  Vascular: Dorsalis Pedis artery and Posterior Tibial artery pedal pulses are 2/4 bilateral with immedate capillary fill time.  There is  no pain with calf compression, swelling, warmth, erythema.   Neruologic: Grossly intact via light touch bilateral.  Negative Tinel sign.  Musculoskeletal: Tenderness to palpation along the plantar medial tubercle of the calcaneus at the insertion of plantar fascia on the left foot. There is no pain along the course of the plantar fascia within the arch of the foot. Plantar fascia appears to be intact. There is no pain with lateral compression of the calcaneus or pain with vibratory sensation. There is no pain along the course or insertion of the achilles tendon. No other areas of tenderness to bilateral lower extremities.Muscular strength 5/5 in all groups tested  bilateral.  Gait: Unassisted, Nonantalgic.       Assessment:   Left heel pain, plantar fasciitis     Plan:  -Treatment options discussed including all alternatives, risks, and complications -Etiology of symptoms were discussed -X-rays were obtained and reviewed with the patient.  No evidence of acute fracture or stress fracture today -Steroid injection performed.  See procedure note below. -Discussed stretching, icing daily.  Discussed modifications and inserts.  Plantar fascial brace. -Prescribed mobic. Discussed side effects of the medication and directed to stop if any are to occur and call the office.   Procedure: Injection Tendon/Ligament Discussed alternatives, risks, complications and verbal consent was obtained.  Location: Left plantar fascia at the glabrous junction; medial approach. Skin Prep: Alcohol. Injectate: 0.5cc 0.5% marcaine plain, 0.5 cc 2% lidocaine plain and, 1 cc kenalog 10. Disposition: Patient tolerated procedure well. Injection site dressed with a band-aid.  Post-injection care was discussed and return precautions discussed.    Vivi Barrack DPM

## 2020-11-10 ENCOUNTER — Telehealth: Payer: Self-pay | Admitting: Podiatry

## 2020-11-10 NOTE — Telephone Encounter (Signed)
Patient called our office stating she was seen a few weeks ago for PF / Heel spur.  She wants to know what else can she take with the meloxicam since her foot hs been painful and throbbing.

## 2020-11-11 NOTE — Telephone Encounter (Signed)
Called and left a message for the patient and relayed the message per Dr Wagoner. Amberlin Utke 

## 2021-01-19 ENCOUNTER — Other Ambulatory Visit: Payer: Self-pay | Admitting: Obstetrics and Gynecology

## 2021-01-19 ENCOUNTER — Other Ambulatory Visit: Payer: Self-pay | Admitting: Family Medicine

## 2021-01-19 DIAGNOSIS — Z1231 Encounter for screening mammogram for malignant neoplasm of breast: Secondary | ICD-10-CM

## 2021-01-20 DIAGNOSIS — E559 Vitamin D deficiency, unspecified: Secondary | ICD-10-CM | POA: Diagnosis not present

## 2021-01-20 DIAGNOSIS — I10 Essential (primary) hypertension: Secondary | ICD-10-CM | POA: Diagnosis not present

## 2021-01-20 DIAGNOSIS — E7800 Pure hypercholesterolemia, unspecified: Secondary | ICD-10-CM | POA: Diagnosis not present

## 2021-01-20 DIAGNOSIS — I739 Peripheral vascular disease, unspecified: Secondary | ICD-10-CM | POA: Diagnosis not present

## 2021-01-20 DIAGNOSIS — G4700 Insomnia, unspecified: Secondary | ICD-10-CM | POA: Diagnosis not present

## 2021-01-27 ENCOUNTER — Other Ambulatory Visit: Payer: Self-pay | Admitting: Podiatry

## 2021-02-22 DIAGNOSIS — Z23 Encounter for immunization: Secondary | ICD-10-CM | POA: Diagnosis not present

## 2021-03-01 ENCOUNTER — Other Ambulatory Visit: Payer: Self-pay

## 2021-03-01 ENCOUNTER — Ambulatory Visit
Admission: RE | Admit: 2021-03-01 | Discharge: 2021-03-01 | Disposition: A | Discharge status: Home / Self Care | Payer: Medicare PPO | Source: Ambulatory Visit | Attending: Family Medicine | Admitting: Family Medicine

## 2021-03-01 DIAGNOSIS — Z1231 Encounter for screening mammogram for malignant neoplasm of breast: Secondary | ICD-10-CM

## 2021-03-06 ENCOUNTER — Ambulatory Visit: Payer: Medicare PPO | Attending: Internal Medicine

## 2021-03-06 DIAGNOSIS — Z23 Encounter for immunization: Secondary | ICD-10-CM

## 2021-03-06 NOTE — Progress Notes (Signed)
   Covid-19 Vaccination Clinic  Name:  Rachel Rios    MRN: [id number] DOB: 1946/01/21  03/06/2021  [name] was observed post Covid-19 immunization for 15 minutes without incident. She was provided with Vaccine Information Sheet and instruction to access the V-Safe system.   [name] was instructed to call 911 with any severe reactions post vaccine: Difficulty breathing  Swelling of face and throat  A fast heartbeat  A bad rash all over body  Dizziness and weakness

## 2021-03-07 ENCOUNTER — Other Ambulatory Visit: Payer: Self-pay | Admitting: Podiatry

## 2021-03-07 NOTE — Telephone Encounter (Signed)
Please advise on refill.

## 2021-04-03 ENCOUNTER — Other Ambulatory Visit (HOSPITAL_BASED_OUTPATIENT_CLINIC_OR_DEPARTMENT_OTHER): Payer: Self-pay

## 2021-04-03 DIAGNOSIS — E559 Vitamin D deficiency, unspecified: Secondary | ICD-10-CM | POA: Diagnosis not present

## 2021-04-03 MED ORDER — PFIZER COVID-19 VAC BIVALENT 30 MCG/0.3ML IM SUSP
INTRAMUSCULAR | 0 refills | Status: DC
  Filled 2021-04-03: qty 0.3, 1d supply, fill #0

## 2021-04-07 DIAGNOSIS — K644 Residual hemorrhoidal skin tags: Secondary | ICD-10-CM | POA: Diagnosis not present

## 2021-04-07 DIAGNOSIS — D128 Benign neoplasm of rectum: Secondary | ICD-10-CM | POA: Diagnosis not present

## 2021-04-07 DIAGNOSIS — K5730 Diverticulosis of large intestine without perforation or abscess without bleeding: Secondary | ICD-10-CM | POA: Diagnosis not present

## 2021-04-07 DIAGNOSIS — D122 Benign neoplasm of ascending colon: Secondary | ICD-10-CM | POA: Diagnosis not present

## 2021-04-07 DIAGNOSIS — K648 Other hemorrhoids: Secondary | ICD-10-CM | POA: Diagnosis not present

## 2021-04-07 DIAGNOSIS — Z86010 Personal history of colonic polyps: Secondary | ICD-10-CM | POA: Diagnosis not present

## 2021-04-09 ENCOUNTER — Other Ambulatory Visit: Payer: Self-pay | Admitting: Podiatry

## 2021-04-11 DIAGNOSIS — D128 Benign neoplasm of rectum: Secondary | ICD-10-CM | POA: Diagnosis not present

## 2021-04-11 DIAGNOSIS — D122 Benign neoplasm of ascending colon: Secondary | ICD-10-CM | POA: Diagnosis not present

## 2021-04-17 DIAGNOSIS — N958 Other specified menopausal and perimenopausal disorders: Secondary | ICD-10-CM | POA: Diagnosis not present

## 2021-04-17 DIAGNOSIS — Z01419 Encounter for gynecological examination (general) (routine) without abnormal findings: Secondary | ICD-10-CM | POA: Diagnosis not present

## 2021-04-17 DIAGNOSIS — Z8262 Family history of osteoporosis: Secondary | ICD-10-CM | POA: Diagnosis not present

## 2021-04-17 DIAGNOSIS — M8588 Other specified disorders of bone density and structure, other site: Secondary | ICD-10-CM | POA: Diagnosis not present

## 2021-04-17 DIAGNOSIS — Z6824 Body mass index (BMI) 24.0-24.9, adult: Secondary | ICD-10-CM | POA: Diagnosis not present

## 2021-04-17 LAB — HM DEXA SCAN

## 2021-05-09 ENCOUNTER — Other Ambulatory Visit: Payer: Self-pay | Admitting: Podiatry

## 2021-07-25 DIAGNOSIS — M8580 Other specified disorders of bone density and structure, unspecified site: Secondary | ICD-10-CM | POA: Diagnosis not present

## 2021-07-25 DIAGNOSIS — Z0000 Encounter for general adult medical examination without abnormal findings: Secondary | ICD-10-CM | POA: Diagnosis not present

## 2021-07-25 DIAGNOSIS — R7303 Prediabetes: Secondary | ICD-10-CM | POA: Diagnosis not present

## 2021-07-25 DIAGNOSIS — E7800 Pure hypercholesterolemia, unspecified: Secondary | ICD-10-CM | POA: Diagnosis not present

## 2021-07-25 DIAGNOSIS — I739 Peripheral vascular disease, unspecified: Secondary | ICD-10-CM | POA: Diagnosis not present

## 2021-07-25 DIAGNOSIS — Z23 Encounter for immunization: Secondary | ICD-10-CM | POA: Diagnosis not present

## 2021-07-25 DIAGNOSIS — I10 Essential (primary) hypertension: Secondary | ICD-10-CM | POA: Diagnosis not present

## 2021-09-18 DIAGNOSIS — H2513 Age-related nuclear cataract, bilateral: Secondary | ICD-10-CM | POA: Diagnosis not present

## 2021-10-19 ENCOUNTER — Other Ambulatory Visit: Payer: Self-pay | Admitting: Podiatry

## 2021-10-27 ENCOUNTER — Other Ambulatory Visit: Payer: Self-pay | Admitting: Podiatry

## 2021-10-29 NOTE — Telephone Encounter (Signed)
Rachel Rios- patient has not been seen in 1 year. Can you see if she still needs this medication? If so, I can refill but she needs to come in as well.

## 2021-10-30 NOTE — Telephone Encounter (Signed)
Please advise 

## 2021-12-15 NOTE — Telephone Encounter (Signed)
Called patient left a voicemail to call back

## 2022-02-22 ENCOUNTER — Other Ambulatory Visit: Payer: Self-pay | Admitting: Family Medicine

## 2022-02-22 DIAGNOSIS — Z1231 Encounter for screening mammogram for malignant neoplasm of breast: Secondary | ICD-10-CM

## 2022-02-26 DIAGNOSIS — Z23 Encounter for immunization: Secondary | ICD-10-CM | POA: Diagnosis not present

## 2022-03-29 ENCOUNTER — Ambulatory Visit: Payer: Medicare PPO

## 2022-04-05 IMAGING — MG MM DIGITAL SCREENING BILAT W/ TOMO AND CAD
8 series · 8 of 24 positions shown · non-contrast
Comparison: Previous exam(s).

CLINICAL DATA: Screening.

EXAM:
DIGITAL SCREENING BILATERAL MAMMOGRAM WITH TOMOSYNTHESIS AND CAD
TECHNIQUE: Bilateral screening digital craniocaudal and mediolateral oblique
mammograms were obtained. Bilateral screening digital breast
tomosynthesis was performed. The images were evaluated with
computer-aided detection.

[L MLO synth-2D]
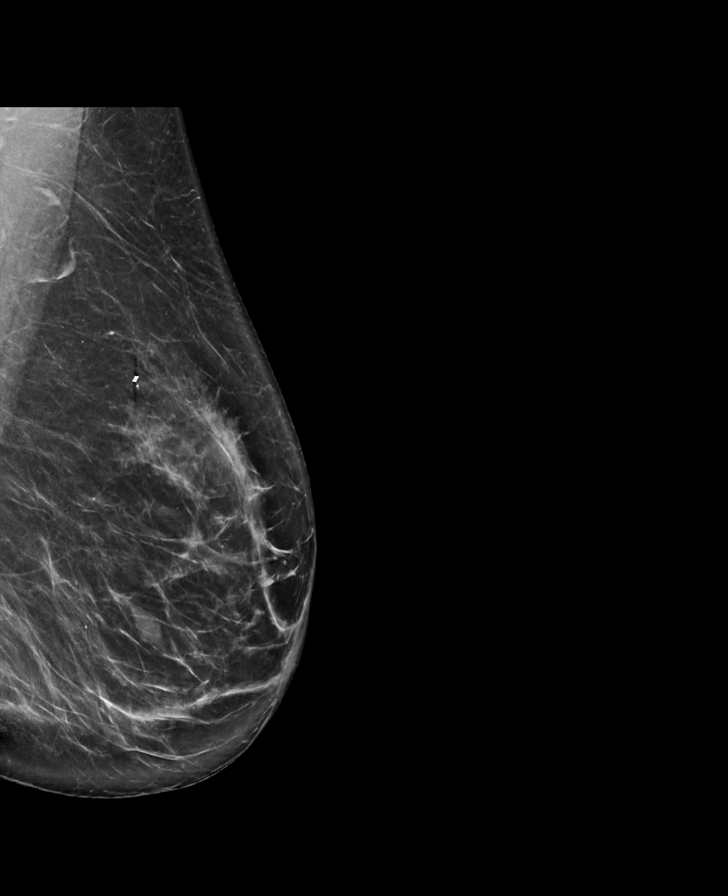

[R CC synth-2D]
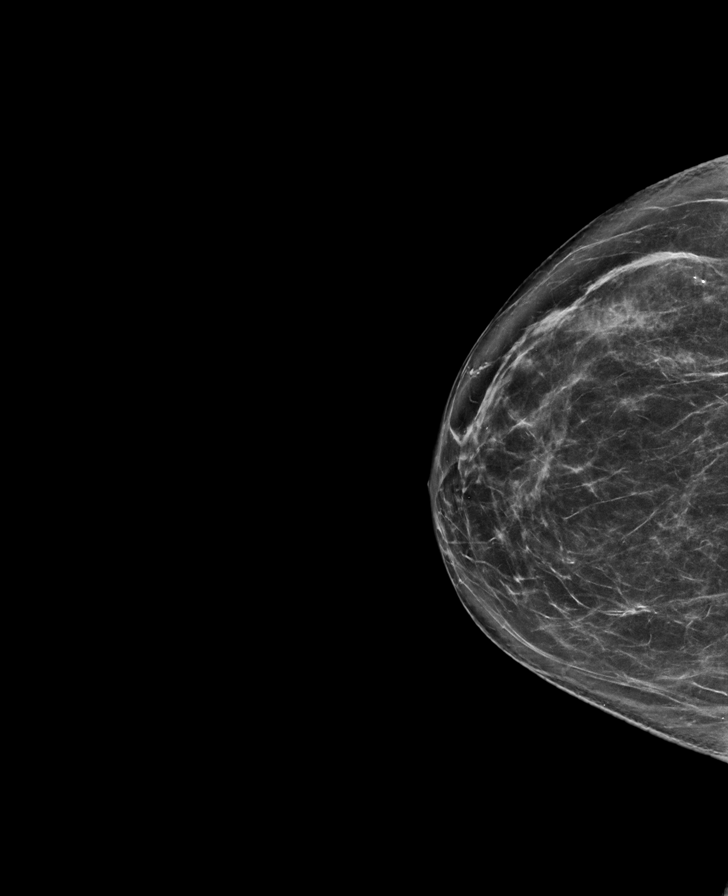

[L CC synth-2D]
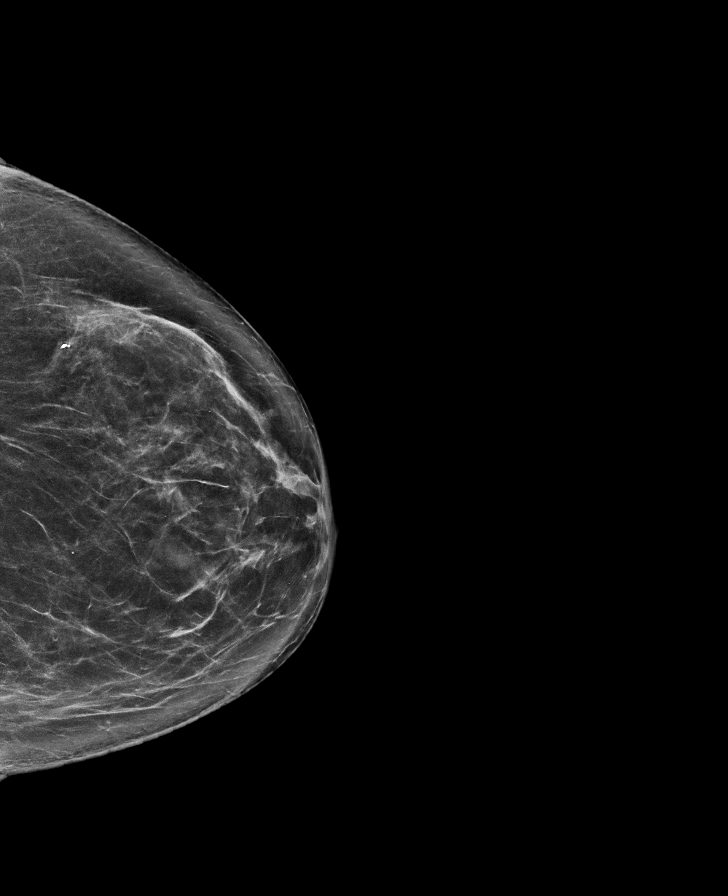

[R MLO synth-2D]
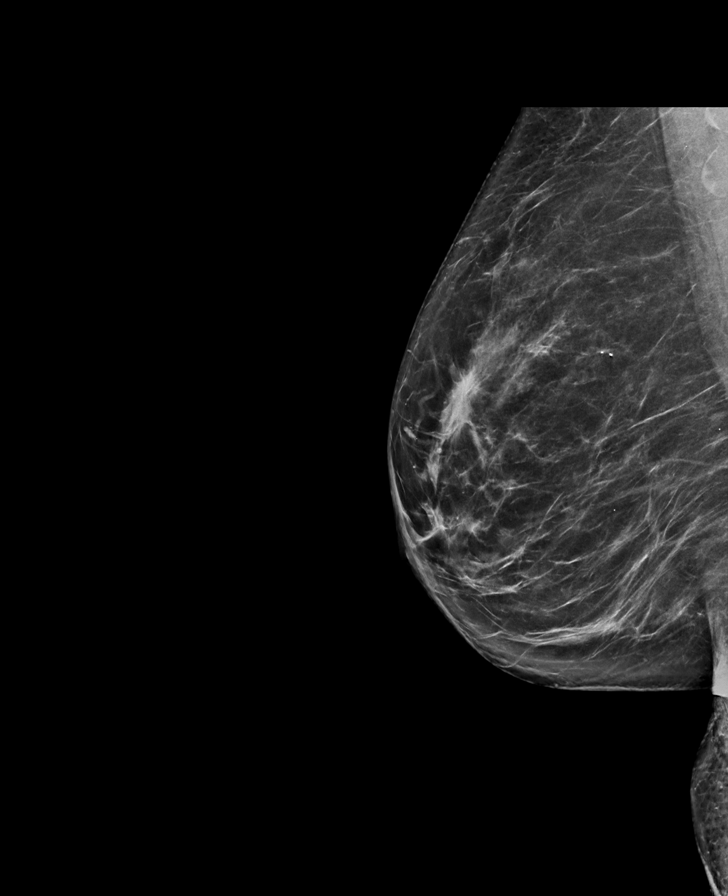

[R MLO tomo · tomo slice 40/79.0]
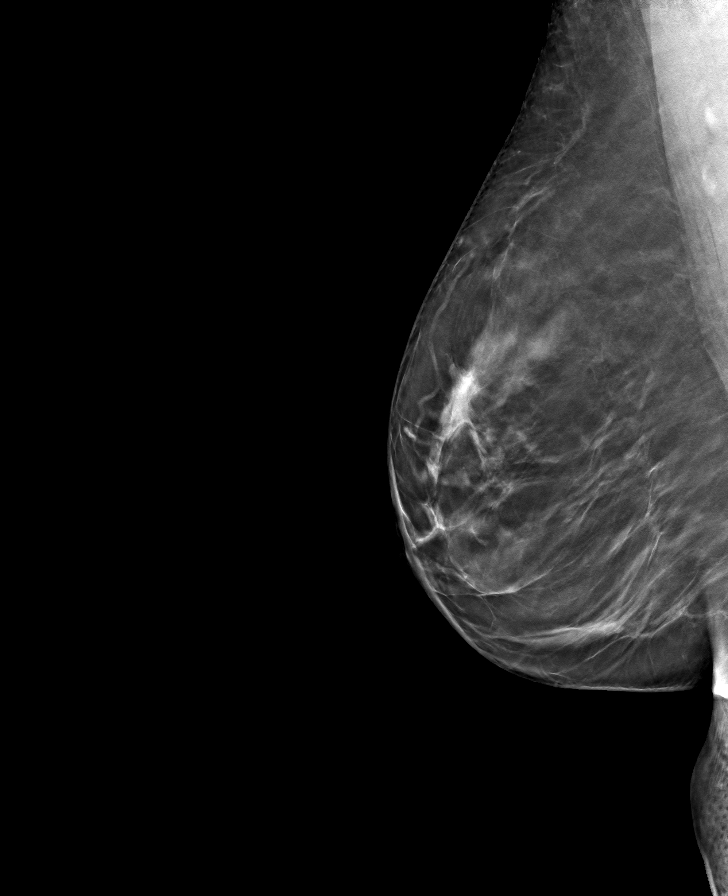

[R CC tomo · tomo slice 40/79.0]
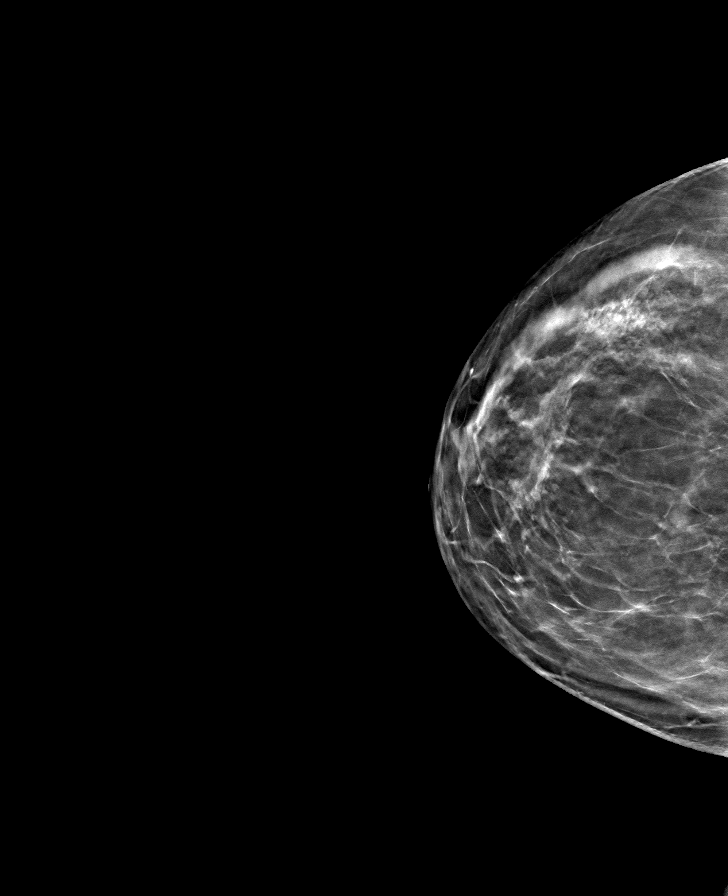

[L CC tomo · tomo slice 41/81.0]
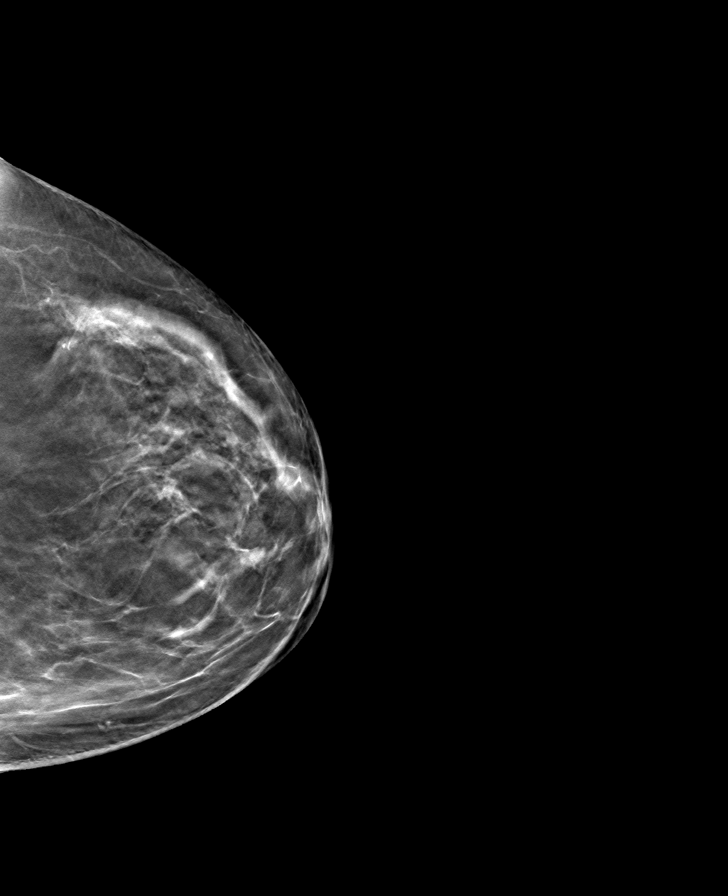

[L MLO tomo · tomo slice 41/82.0]
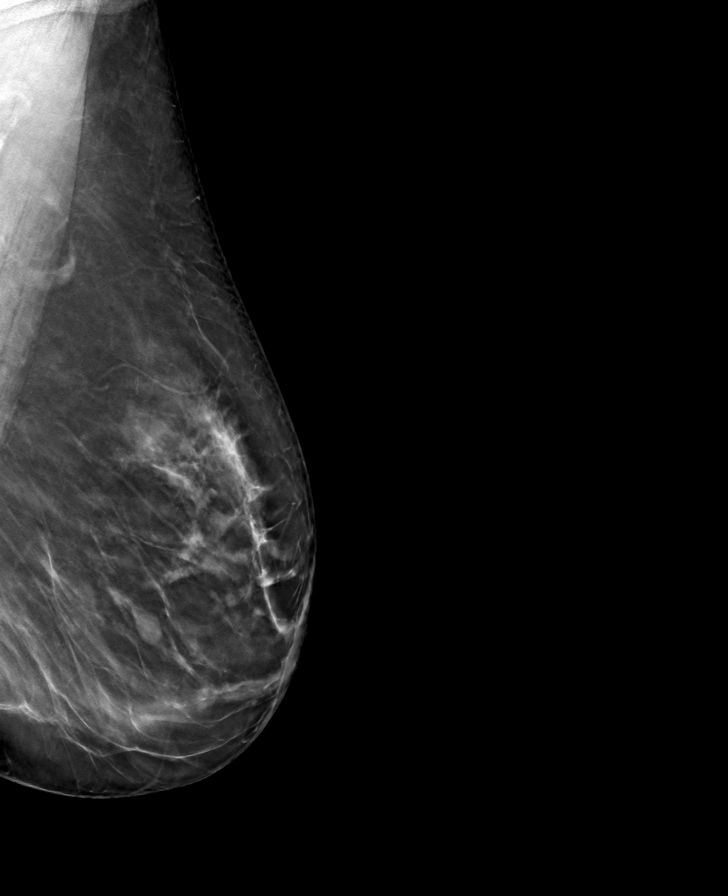

[8 of 24 positions shown; findings below may reference images not displayed]

ACR Breast Density Category b: There are scattered areas of
fibroglandular density.
FINDINGS: There are no findings suspicious for malignancy.
IMPRESSION: No mammographic evidence of malignancy. A result letter of this
screening mammogram will be mailed directly to the patient.

RECOMMENDATION:
Screening mammogram in one year. (Code:51-O-LD2)

BI-RADS CATEGORY  1: Negative.

## 2022-04-19 ENCOUNTER — Ambulatory Visit
Admission: RE | Admit: 2022-04-19 | Discharge: 2022-04-19 | Disposition: A | Discharge status: Home / Self Care | Payer: Medicare PPO | Source: Ambulatory Visit | Attending: Family Medicine | Admitting: Family Medicine

## 2022-04-19 DIAGNOSIS — Z1231 Encounter for screening mammogram for malignant neoplasm of breast: Secondary | ICD-10-CM | POA: Diagnosis not present

## 2022-05-01 DIAGNOSIS — L720 Epidermal cyst: Secondary | ICD-10-CM | POA: Diagnosis not present

## 2022-05-01 DIAGNOSIS — L718 Other rosacea: Secondary | ICD-10-CM | POA: Diagnosis not present

## 2022-08-01 DIAGNOSIS — B351 Tinea unguium: Secondary | ICD-10-CM | POA: Diagnosis not present

## 2022-08-01 DIAGNOSIS — I739 Peripheral vascular disease, unspecified: Secondary | ICD-10-CM | POA: Diagnosis not present

## 2022-08-01 DIAGNOSIS — R7303 Prediabetes: Secondary | ICD-10-CM | POA: Diagnosis not present

## 2022-08-01 DIAGNOSIS — M8580 Other specified disorders of bone density and structure, unspecified site: Secondary | ICD-10-CM | POA: Diagnosis not present

## 2022-08-01 DIAGNOSIS — F419 Anxiety disorder, unspecified: Secondary | ICD-10-CM | POA: Diagnosis not present

## 2022-08-01 DIAGNOSIS — I10 Essential (primary) hypertension: Secondary | ICD-10-CM | POA: Diagnosis not present

## 2022-08-01 DIAGNOSIS — Z0000 Encounter for general adult medical examination without abnormal findings: Secondary | ICD-10-CM | POA: Diagnosis not present

## 2022-08-01 DIAGNOSIS — E7800 Pure hypercholesterolemia, unspecified: Secondary | ICD-10-CM | POA: Diagnosis not present

## 2022-08-23 DIAGNOSIS — H2513 Age-related nuclear cataract, bilateral: Secondary | ICD-10-CM | POA: Diagnosis not present

## 2022-08-23 DIAGNOSIS — H5203 Hypermetropia, bilateral: Secondary | ICD-10-CM | POA: Diagnosis not present

## 2022-08-23 DIAGNOSIS — H524 Presbyopia: Secondary | ICD-10-CM | POA: Diagnosis not present

## 2022-08-23 DIAGNOSIS — H16223 Keratoconjunctivitis sicca, not specified as Sjogren's, bilateral: Secondary | ICD-10-CM | POA: Diagnosis not present

## 2022-08-23 DIAGNOSIS — H52221 Regular astigmatism, right eye: Secondary | ICD-10-CM | POA: Diagnosis not present

## 2023-02-25 DIAGNOSIS — Z23 Encounter for immunization: Secondary | ICD-10-CM | POA: Diagnosis not present

## 2023-03-18 ENCOUNTER — Other Ambulatory Visit: Payer: Self-pay | Admitting: Family Medicine

## 2023-03-18 DIAGNOSIS — Z1231 Encounter for screening mammogram for malignant neoplasm of breast: Secondary | ICD-10-CM

## 2023-04-24 ENCOUNTER — Ambulatory Visit
Admission: RE | Admit: 2023-04-24 | Discharge: 2023-04-24 | Disposition: A | Discharge status: Home / Self Care | Payer: Medicare PPO | Source: Ambulatory Visit | Attending: Family Medicine | Admitting: Family Medicine

## 2023-04-24 DIAGNOSIS — Z1231 Encounter for screening mammogram for malignant neoplasm of breast: Secondary | ICD-10-CM | POA: Diagnosis not present

## 2023-06-13 DIAGNOSIS — N958 Other specified menopausal and perimenopausal disorders: Secondary | ICD-10-CM | POA: Diagnosis not present

## 2023-06-13 DIAGNOSIS — Z01419 Encounter for gynecological examination (general) (routine) without abnormal findings: Secondary | ICD-10-CM | POA: Diagnosis not present

## 2023-06-13 DIAGNOSIS — Z6824 Body mass index (BMI) 24.0-24.9, adult: Secondary | ICD-10-CM | POA: Diagnosis not present

## 2023-06-13 DIAGNOSIS — M8588 Other specified disorders of bone density and structure, other site: Secondary | ICD-10-CM | POA: Diagnosis not present

## 2023-06-13 LAB — HM DEXA SCAN

## 2023-06-28 NOTE — Progress Notes (Signed)
   I, Rachel Rios, CMA acting as a scribe for Rachel Juniper, MD.  Rachel Rios is a 78 y.o. female who presents to [facility] today for osteoporosis management. Family hx of breast cancer. Notes difficulty swallowing pills.   DEXA scan (date, T-score): Spine = -2.0, L-FN= -1.9, R-FN= -1.5 Prior treatment: none History of Hip, Spine, or Wrist Fx: no Heart disease or stroke: no Cancer: no Kidney Disease: no Gastric/Peptic Ulcer: no Gastric bypass surgery: no Severe GERD: hiatal hernia - GERD Hx of seizures: no Age at Menopause: 78y/o (hysterectomy) Calcium intake: no Vitamin D  intake: yes: 4000 iU Hormone replacement therapy: hormone patches in the past Smoking history: never smoked Alcohol: no Exercise: minimal weight-bearing exercise outside of stairs Major dental work in past year: no Parents with hip/spine fracture: yes Height loss: yes: 64" to 63.5" Denies chronic steroid use   Pertinent review of systems: No fevers or chills  Relevant historical information: Hiatal hernia.  She does have some neck and low back pain.  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  She needs to be able to assist him to stand and needs to do all of the cooking and cleaning and driving for the household.   Exam:  BP (!) 164/72   Pulse (!) 44   Ht 5\' 4"  (1.626 m)   Wt 146 lb (66.2 kg)   SpO2 99%   BMI 25.06 kg/m  General: Well Developed, well nourished, and in no acute distress.   MSK: C-spine and L-spine normal motion normal gait.    Lab and Radiology Results  DEXA scan dated June 13, 2023.  T-score -2.0 AP spine.    Assessment and Plan: 78 y.o. female with osteopenia.  This occurs in the setting of increased physical activity while caring for her husband and managing the household.  She does have some neck and back pain.  Plan for physical therapy.  This could  improve her core strength and allow her to be more functional at home and should help her back pain and neck pain and we can work on home exercises that she can do on her own for bone for osteopenia treatment. [facility] in [place] is nearby and she has used them before.  Will check vitamin D  to make sure her levels are adequate.  She is taking 4000 units of vitamin D  a day.   PDMP not reviewed this encounter. Orders Placed This Encounter  Procedures   VITAMIN D  25 Hydroxy (Vit-D Deficiency, Fractures)    Osteoporis    Standing Status:   Future    Number of Occurrences:   1    Expiration Date:   06/30/2024   Ambulatory referral to Physical Therapy    Referral Priority:   Routine    Referral Type:   Physical Medicine    Referral Reason:   Specialty Services Required    Requested Specialty:   Physical Therapy    Number of Visits Requested:   1   No orders of the defined types were placed in this encounter.    Discussed warning signs or symptoms. Please see discharge instructions. Patient expresses understanding.   The above documentation has been reviewed and is accurate and complete Rachel Rios, M.D.

## 2023-07-01 ENCOUNTER — Ambulatory Visit: Payer: Medicare PPO | Admitting: Family Medicine

## 2023-07-01 ENCOUNTER — Encounter: Payer: Self-pay | Admitting: Family Medicine

## 2023-07-01 VITALS — BP 164/72 | HR 44 | Ht 64.0 in | Wt 146.0 lb

## 2023-07-01 DIAGNOSIS — Z636 Dependent relative needing care at home: Secondary | ICD-10-CM | POA: Diagnosis not present

## 2023-07-01 DIAGNOSIS — M8588 Other specified disorders of bone density and structure, other site: Secondary | ICD-10-CM

## 2023-07-01 DIAGNOSIS — G8929 Other chronic pain: Secondary | ICD-10-CM | POA: Diagnosis not present

## 2023-07-01 DIAGNOSIS — M545 Low back pain, unspecified: Secondary | ICD-10-CM | POA: Diagnosis not present

## 2023-07-01 LAB — VITAMIN D 25 HYDROXY (VIT D DEFICIENCY, FRACTURES): VITD: 57.74 ng/mL (ref 30.00–100.00)

## 2023-07-01 NOTE — Patient Instructions (Addendum)
 Thank you for coming in today.  Stop by lab before you leave to check your Vitamin D  levels   Physical Therapy ordered with [facility] in [place] for back pain and osteopenia

## 2023-07-02 NOTE — Progress Notes (Signed)
Vitamin D level is 57.  This is plenty.  Please continue 2000-4000 units of vitamin D daily.

## 2023-07-03 DIAGNOSIS — R293 Abnormal posture: Secondary | ICD-10-CM | POA: Diagnosis not present

## 2023-07-03 DIAGNOSIS — M6281 Muscle weakness (generalized): Secondary | ICD-10-CM | POA: Diagnosis not present

## 2023-07-03 DIAGNOSIS — M8580 Other specified disorders of bone density and structure, unspecified site: Secondary | ICD-10-CM | POA: Diagnosis not present

## 2023-07-05 DIAGNOSIS — M6281 Muscle weakness (generalized): Secondary | ICD-10-CM | POA: Diagnosis not present

## 2023-07-05 DIAGNOSIS — R293 Abnormal posture: Secondary | ICD-10-CM | POA: Diagnosis not present

## 2023-07-05 DIAGNOSIS — M8580 Other specified disorders of bone density and structure, unspecified site: Secondary | ICD-10-CM | POA: Diagnosis not present

## 2023-07-09 DIAGNOSIS — R293 Abnormal posture: Secondary | ICD-10-CM | POA: Diagnosis not present

## 2023-07-09 DIAGNOSIS — M6281 Muscle weakness (generalized): Secondary | ICD-10-CM | POA: Diagnosis not present

## 2023-07-09 DIAGNOSIS — M8580 Other specified disorders of bone density and structure, unspecified site: Secondary | ICD-10-CM | POA: Diagnosis not present

## 2023-07-16 DIAGNOSIS — M6281 Muscle weakness (generalized): Secondary | ICD-10-CM | POA: Diagnosis not present

## 2023-07-16 DIAGNOSIS — R293 Abnormal posture: Secondary | ICD-10-CM | POA: Diagnosis not present

## 2023-07-16 DIAGNOSIS — M8580 Other specified disorders of bone density and structure, unspecified site: Secondary | ICD-10-CM | POA: Diagnosis not present

## 2023-07-18 DIAGNOSIS — R293 Abnormal posture: Secondary | ICD-10-CM | POA: Diagnosis not present

## 2023-07-18 DIAGNOSIS — M6281 Muscle weakness (generalized): Secondary | ICD-10-CM | POA: Diagnosis not present

## 2023-07-18 DIAGNOSIS — M8580 Other specified disorders of bone density and structure, unspecified site: Secondary | ICD-10-CM | POA: Diagnosis not present

## 2023-07-23 DIAGNOSIS — R293 Abnormal posture: Secondary | ICD-10-CM | POA: Diagnosis not present

## 2023-07-23 DIAGNOSIS — M8580 Other specified disorders of bone density and structure, unspecified site: Secondary | ICD-10-CM | POA: Diagnosis not present

## 2023-07-23 DIAGNOSIS — M6281 Muscle weakness (generalized): Secondary | ICD-10-CM | POA: Diagnosis not present

## 2023-07-25 DIAGNOSIS — R293 Abnormal posture: Secondary | ICD-10-CM | POA: Diagnosis not present

## 2023-07-25 DIAGNOSIS — M6281 Muscle weakness (generalized): Secondary | ICD-10-CM | POA: Diagnosis not present

## 2023-07-25 DIAGNOSIS — M8580 Other specified disorders of bone density and structure, unspecified site: Secondary | ICD-10-CM | POA: Diagnosis not present

## 2023-07-29 DIAGNOSIS — M8580 Other specified disorders of bone density and structure, unspecified site: Secondary | ICD-10-CM | POA: Diagnosis not present

## 2023-07-29 DIAGNOSIS — M6281 Muscle weakness (generalized): Secondary | ICD-10-CM | POA: Diagnosis not present

## 2023-07-29 DIAGNOSIS — R293 Abnormal posture: Secondary | ICD-10-CM | POA: Diagnosis not present

## 2023-07-31 DIAGNOSIS — M8580 Other specified disorders of bone density and structure, unspecified site: Secondary | ICD-10-CM | POA: Diagnosis not present

## 2023-07-31 DIAGNOSIS — M6281 Muscle weakness (generalized): Secondary | ICD-10-CM | POA: Diagnosis not present

## 2023-07-31 DIAGNOSIS — R293 Abnormal posture: Secondary | ICD-10-CM | POA: Diagnosis not present

## 2023-08-06 DIAGNOSIS — R293 Abnormal posture: Secondary | ICD-10-CM | POA: Diagnosis not present

## 2023-08-06 DIAGNOSIS — M8580 Other specified disorders of bone density and structure, unspecified site: Secondary | ICD-10-CM | POA: Diagnosis not present

## 2023-08-06 DIAGNOSIS — M6281 Muscle weakness (generalized): Secondary | ICD-10-CM | POA: Diagnosis not present

## 2023-08-07 DIAGNOSIS — Z136 Encounter for screening for cardiovascular disorders: Secondary | ICD-10-CM | POA: Diagnosis not present

## 2023-08-07 DIAGNOSIS — I10 Essential (primary) hypertension: Secondary | ICD-10-CM | POA: Diagnosis not present

## 2023-08-07 DIAGNOSIS — E7800 Pure hypercholesterolemia, unspecified: Secondary | ICD-10-CM | POA: Diagnosis not present

## 2023-08-07 DIAGNOSIS — Z0000 Encounter for general adult medical examination without abnormal findings: Secondary | ICD-10-CM | POA: Diagnosis not present

## 2023-08-08 DIAGNOSIS — R293 Abnormal posture: Secondary | ICD-10-CM | POA: Diagnosis not present

## 2023-08-08 DIAGNOSIS — M8580 Other specified disorders of bone density and structure, unspecified site: Secondary | ICD-10-CM | POA: Diagnosis not present

## 2023-08-08 DIAGNOSIS — M6281 Muscle weakness (generalized): Secondary | ICD-10-CM | POA: Diagnosis not present

## 2023-08-27 DIAGNOSIS — H16223 Keratoconjunctivitis sicca, not specified as Sjogren's, bilateral: Secondary | ICD-10-CM | POA: Diagnosis not present

## 2023-08-27 DIAGNOSIS — H524 Presbyopia: Secondary | ICD-10-CM | POA: Diagnosis not present

## 2023-08-27 DIAGNOSIS — H2513 Age-related nuclear cataract, bilateral: Secondary | ICD-10-CM | POA: Diagnosis not present

## 2023-12-25 DIAGNOSIS — L718 Other rosacea: Secondary | ICD-10-CM | POA: Diagnosis not present

## 2023-12-25 DIAGNOSIS — L821 Other seborrheic keratosis: Secondary | ICD-10-CM | POA: Diagnosis not present

## 2024-03-09 DIAGNOSIS — Z23 Encounter for immunization: Secondary | ICD-10-CM | POA: Diagnosis not present

## 2024-03-30 ENCOUNTER — Other Ambulatory Visit: Payer: Self-pay | Admitting: Family Medicine

## 2024-03-30 DIAGNOSIS — Z1231 Encounter for screening mammogram for malignant neoplasm of breast: Secondary | ICD-10-CM

## 2024-04-28 ENCOUNTER — Ambulatory Visit

## 2024-05-22 ENCOUNTER — Ambulatory Visit
Admission: RE | Admit: 2024-05-22 | Discharge: 2024-05-22 | Disposition: A | Discharge status: Home / Self Care | Source: Ambulatory Visit | Attending: Family Medicine | Admitting: Family Medicine

## 2024-05-22 DIAGNOSIS — Z1231 Encounter for screening mammogram for malignant neoplasm of breast: Secondary | ICD-10-CM
